# Patient Record
Sex: Female | Born: 1965 | Race: White | Hispanic: No | Marital: Single | Smoking: Former smoker
Health system: Southern US, Community
[De-identification: ages and names within clinical notes are randomized; demographics above are authoritative.]

## PROBLEM LIST (undated history)

## (undated) DIAGNOSIS — J189 Pneumonia, unspecified organism: Secondary | ICD-10-CM

## (undated) DIAGNOSIS — J45909 Unspecified asthma, uncomplicated: Secondary | ICD-10-CM

## (undated) HISTORY — PX: CARDIAC CATHETERIZATION: SHX172

---

## 2009-05-10 ENCOUNTER — Ambulatory Visit: Payer: Self-pay | Admitting: Interventional Radiology

## 2009-05-10 ENCOUNTER — Emergency Department (HOSPITAL_BASED_OUTPATIENT_CLINIC_OR_DEPARTMENT_OTHER): Admission: EM | Admit: 2009-05-10 | Discharge: 2009-05-10 | Payer: Self-pay | Admitting: Emergency Medicine

## 2010-09-10 ENCOUNTER — Emergency Department (HOSPITAL_BASED_OUTPATIENT_CLINIC_OR_DEPARTMENT_OTHER): Admission: EM | Admit: 2010-09-10 | Discharge: 2010-04-24 | Payer: Self-pay | Admitting: Emergency Medicine

## 2010-12-04 HISTORY — PX: OTHER SURGICAL HISTORY: SHX169

## 2011-01-10 LAB — COMPREHENSIVE METABOLIC PANEL
ALT: 8 U/L (ref 0–35)
AST: 17 U/L (ref 0–37)
Albumin: 3.8 g/dL (ref 3.5–5.2)
Alkaline Phosphatase: 62 U/L (ref 39–117)
CO2: 26 mEq/L (ref 19–32)
Chloride: 104 mEq/L (ref 96–112)
GFR calc Af Amer: >60 mL/min (ref >60)
GFR calc non Af Amer: >60 mL/min (ref >60)
Potassium: 3.4 mEq/L — ABNORMAL LOW (ref 3.5–5.1)
Total Bilirubin: 0.2 mg/dL — ABNORMAL LOW (ref 0.3–1.2)

## 2011-01-10 LAB — URINE MICROSCOPIC-ADD ON

## 2011-01-10 LAB — CBC
Platelets: 190 10*3/uL (ref 150–400)
RBC: 3.97 MIL/uL (ref 3.87–5.11)
WBC: 7 10*3/uL (ref 4.0–10.5)

## 2011-01-10 LAB — DIFFERENTIAL
Basophils Absolute: 0 10*3/uL (ref 0.0–0.1)
Basophils Relative: 1 % (ref 0–1)
Eosinophils Absolute: 0.2 10*3/uL (ref 0.0–0.7)
Eosinophils Relative: 2 % (ref 0–5)
Monocytes Absolute: 0.4 10*3/uL (ref 0.1–1.0)

## 2011-01-10 LAB — URINALYSIS, ROUTINE W REFLEX MICROSCOPIC
Bilirubin Urine: NEGATIVE
Glucose, UA: NEGATIVE mg/dL
Ketones, ur: NEGATIVE mg/dL
Protein, ur: NEGATIVE mg/dL

## 2011-09-15 ENCOUNTER — Ambulatory Visit (INDEPENDENT_AMBULATORY_CARE_PROVIDER_SITE_OTHER): Payer: Self-pay | Admitting: Surgery

## 2011-09-23 ENCOUNTER — Encounter (INDEPENDENT_AMBULATORY_CARE_PROVIDER_SITE_OTHER): Payer: Self-pay | Admitting: Surgery

## 2011-09-23 ENCOUNTER — Ambulatory Visit (INDEPENDENT_AMBULATORY_CARE_PROVIDER_SITE_OTHER): Payer: Federal, State, Local not specified - PPO | Admitting: Surgery

## 2011-09-23 DIAGNOSIS — D179 Benign lipomatous neoplasm, unspecified: Secondary | ICD-10-CM

## 2011-09-23 NOTE — Progress Notes (Signed)
Patient ID: Katherine Sims, female   DOB: [birth year], 45 y.o.   MRN: [id number]  Chief Complaint  Patient presents with  . Other    Eval of multiple skin nodules (arms, back, legs)    HPI Katherine Sims is a 45 y.o. female. HPI The patient presents today at the request of Dr. Dierdre Forth due to multiple painful skin nodules. The patient is one overlying her left anterior thigh, one overlying her right triceps muscle, slurred over Hemovac, and 2 others along her right flank. These are been painful. One in her left anterior thigh as been present for about 6 months. He has grown minimally. There is been no redness or drainage from these nodules.  History reviewed. No pertinent past medical history.  Past Surgical History  Procedure Date  . Heart catherterization 12/04/10    Family History  Problem Relation Age of Onset  . Cancer Maternal Aunt     breast    Social History History  Substance Use Topics  . Smoking status: Current Everyday Smoker -- 1.0 packs/day    Types: Cigarettes  . Smokeless tobacco: Never Used  . Alcohol Use: Yes     occasional    No Known Allergies  Current Outpatient Prescriptions  Medication Sig Dispense Refill  . Fluticasone-Salmeterol (ADVAIR DISKUS) 100-50 MCG/DOSE AEPB Inhale 1 puff into the lungs as needed.          Review of Systems Review of Systems  Constitutional: Positive for fatigue. Negative for unexpected weight change.  HENT: Negative.   Eyes: Negative.   Respiratory: Negative.   Cardiovascular: Negative.   Gastrointestinal: Negative.   Genitourinary: Negative.   Musculoskeletal: Positive for myalgias, back pain, joint swelling and arthralgias.  Skin: Negative.   Hematological: Negative.   Psychiatric/Behavioral: The patient is nervous/anxious.     Blood pressure 132/90, pulse 76, temperature 97.8 F (36.6 C), temperature source Temporal, resp. rate 18, height 5\' 4"  (1.626 m), weight 180 lb 12.8 oz (82.01 kg).  Physical  Exam Physical Exam  Constitutional: She is oriented to person, place, and time. She appears well-developed and well-nourished.  HENT:  Head: Normocephalic and atraumatic.  Eyes: EOM are normal. Pupils are equal, round, and reactive to light.  Neck: Normal range of motion. Neck supple.  Musculoskeletal: She exhibits tenderness.  Neurological: She is alert and oriented to person, place, and time. A cranial nerve deficit is present.  Skin:     Psychiatric: She has a normal mood and affect. Her behavior is normal. Judgment and thought content normal.    Data Reviewed   Assessment    Lipoma left thigh 1 cm Lipoma right arm 1 cm Fibromyalgia?    Plan    The patient is too small subcutaneous nodules. These feel to be small lipoma. The areas I checked which revealed her lower back and right flank did not have any appreciable nodule. She has significant musculoskeletal pain. She may have fibromyalgia. These nodules do not require surgical attention since this will not relieve her multiple complaints. They are subcutaneous and are not impinging on any major neural vascular structures. I recommended that she speak with her primary care doctor for further workup.       Katherine Sims A. 09/23/2011, 4:36 PM

## 2011-09-23 NOTE — Patient Instructions (Signed)
Lipoma A lipoma is a noncancerous (benign) tumor composed of fat cells. They are usually found under the skin (subcutaneous). A lipoma may occur in any tissue of the body that contains fat. Common areas for lipomas to appear include the back, shoulders, buttocks, and thighs. Lipomas are a very common soft tissue growth. They are soft and grow slowly. Most problems caused by a lipoma depend on where it is growing. DIAGNOSIS  A lipoma can be diagnosed with a physical exam. These tumors rarely become cancerous, but radiographic studies can help determine this for certain. Studies used may include:  Computerized X-ray scans (CT or CAT scan).   Computerized magnetic scans (MRI).  TREATMENT  Small lipomas that are not causing problems may be watched. If a lipoma continues to enlarge or causes problems, removal is often the best treatment. Lipomas can also be removed to improve appearance. Surgery is done to remove the fatty cells and the surrounding capsule. Most often, this is done with medicine that numbs the area (local anesthetic). The removed tissue is examined under a microscope to make sure it is not cancerous. Keep all follow-up appointments with your caregiver. SEEK MEDICAL CARE IF:   The lipoma becomes larger Document Released: 09/10/2002 Document Revised: 06/02/2011 Document Reviewed: 02/20/2010 [facility] Patient Information 2012 [place], [place].

## 2013-10-04 ENCOUNTER — Ambulatory Visit: Payer: Federal, State, Local not specified - PPO

## 2013-10-04 ENCOUNTER — Ambulatory Visit (INDEPENDENT_AMBULATORY_CARE_PROVIDER_SITE_OTHER): Payer: Federal, State, Local not specified - PPO | Admitting: Family Medicine

## 2013-10-04 VITALS — BP 110/80 | HR 59 | Temp 98.2°F | Resp 16 | Ht 64.0 in | Wt 180.0 lb

## 2013-10-04 DIAGNOSIS — J069 Acute upper respiratory infection, unspecified: Secondary | ICD-10-CM

## 2013-10-04 DIAGNOSIS — F17200 Nicotine dependence, unspecified, uncomplicated: Secondary | ICD-10-CM

## 2013-10-04 DIAGNOSIS — J209 Acute bronchitis, unspecified: Secondary | ICD-10-CM

## 2013-10-04 LAB — POCT CBC
GRANULOCYTE PERCENT: 59.5 % (ref 37–80)
HEMATOCRIT: 44.4 % (ref 37.7–47.9)
Hemoglobin: 14.4 g/dL (ref 12.2–16.2)
Lymph, poc: 2.4 (ref 0.6–3.4)
MCH, POC: 31 pg (ref 27–31.2)
MCHC: 32.4 g/dL (ref 31.8–35.4)
MCV: 95.4 fL (ref 80–97)
MID (cbc): 0.4 (ref 0–0.9)
MPV: 8.7 fL (ref 0–99.8)
POC GRANULOCYTE: 4.1 (ref 2–6.9)
POC LYMPH %: 34.4 % (ref 10–50)
POC MID %: 6.1 % (ref 0–12)
Platelet Count, POC: 212 10*3/uL (ref 142–424)
RBC: 4.65 M/uL (ref 4.04–5.48)
RDW, POC: 12.6 %
WBC: 6.9 10*3/uL (ref 4.6–10.2)

## 2013-10-04 MED ORDER — HYDROCODONE-HOMATROPINE 5-1.5 MG/5ML PO SYRP: 5.0000 mL | ORAL_SOLUTION | Freq: Three times a day (TID) | ORAL | Status: DC | PRN

## 2013-10-04 MED ORDER — CETIRIZINE HCL 10 MG PO TABS
10.0000 mg | ORAL_TABLET | Freq: Every day | ORAL | Status: DC
Start: 2013-10-04 — End: 2018-08-21

## 2013-10-04 MED ORDER — METHYLPREDNISOLONE ACETATE 80 MG/ML IJ SUSP
80.0000 mg | Freq: Once | INTRAMUSCULAR | Status: AC
  Administered 2013-10-04: 80 mg via INTRAMUSCULAR

## 2013-10-04 MED ORDER — PSEUDOEPHEDRINE HCL ER 120 MG PO TB12: 120.0000 mg | ORAL_TABLET | Freq: Two times a day (BID) | ORAL | Status: AC

## 2013-10-04 MED ORDER — ALBUTEROL SULFATE HFA 108 (90 BASE) MCG/ACT IN AERS: 2.0000 | INHALATION_SPRAY | RESPIRATORY_TRACT | Status: DC | PRN

## 2013-10-04 MED ORDER — FLUTICASONE-SALMETEROL 100-50 MCG/DOSE IN AEPB
1.0000 | INHALATION_SPRAY | Freq: Two times a day (BID) | RESPIRATORY_TRACT | Status: DC
Start: 2013-10-04 — End: 2018-08-21

## 2013-10-04 MED ORDER — AZITHROMYCIN 250 MG PO TABS: ORAL_TABLET | ORAL | Status: DC

## 2013-10-04 NOTE — Patient Instructions (Signed)
Acute Bronchitis Bronchitis is inflammation of the airways that extend from the windpipe into the lungs (bronchi). The inflammation often causes mucus to develop. This leads to a cough, which is the most common symptom of bronchitis.  In acute bronchitis, the condition usually develops suddenly and goes away over time, usually in a couple weeks. Smoking, allergies, and asthma can make bronchitis worse. Repeated episodes of bronchitis may cause further lung problems.  CAUSES Acute bronchitis is most often caused by the same virus that causes a cold. The virus can spread from person to person (contagious).  SIGNS AND SYMPTOMS   Cough.   Fever.   Coughing up mucus.   Body aches.   Chest congestion.   Chills.   Shortness of breath.   Sore throat.  DIAGNOSIS  Acute bronchitis is usually diagnosed through a physical exam. Tests, such as chest X-rays, are sometimes done to rule out other conditions.  TREATMENT  Acute bronchitis usually goes away in a couple weeks. Often times, no medical treatment is necessary. Medicines are sometimes given for relief of fever or cough. Antibiotics are usually not needed but may be prescribed in certain situations. In some cases, an inhaler may be recommended to help reduce shortness of breath and control the cough. A cool mist vaporizer may also be used to help thin bronchial secretions and make it easier to clear the chest.  HOME CARE INSTRUCTIONS  Get plenty of rest.   Drink enough fluids to keep your urine clear or pale yellow (unless you have a medical condition that requires fluid restriction). Increasing fluids may help thin your secretions and will prevent dehydration.   Only take over-the-counter or prescription medicines as directed by your health care provider.   Avoid smoking and secondhand smoke. Exposure to cigarette smoke or irritating chemicals will make bronchitis worse. If you are a smoker, consider using nicotine gum or skin  patches to help control withdrawal symptoms. Quitting smoking will help your lungs heal faster.   Reduce the chances of another bout of acute bronchitis by washing your hands frequently, avoiding people with cold symptoms, and trying not to touch your hands to your mouth, nose, or eyes.   Follow up with your health care provider as directed.  SEEK MEDICAL CARE IF: Your symptoms do not improve after 1 week of treatment.  SEEK IMMEDIATE MEDICAL CARE IF:  You develop an increased fever or chills.   You have chest pain.   You have severe shortness of breath.  You have bloody sputum.   You develop dehydration.  You develop fainting.  You develop repeated vomiting.  You develop a severe headache. MAKE SURE YOU:   Understand these instructions.  Will watch your condition.  Will get help right away if you are not doing well or get worse. Document Released: 10/28/2004 Document Revised: 05/23/2013 Document Reviewed: 03/13/2013 [employer] Patient Information 2014 [employer].  

## 2013-10-04 NOTE — Progress Notes (Signed)
Subjective:    Patient ID: Katherine Sims, female    DOB: [birth year], 48 y.o.   MRN: [id number] Chief Complaint  Patient presents with  . Cough    X 4 WEEK  . Laryngitis    HPI  No h/o asthma, but is a long-time smoker and has been smoking more lately - not yet up to 1 ppd.  When her sxs began over a month ago and seemed to be mainly seasonal allergy sxs - hay fever type - w/ a lot of clear rhinitis, cough prod of clear sputum, severe HA - better today but feel like cough and congestion is moved down to chest - getting severe chest pain w/ coughing. No fevers but has had chills and sweats. No sinus pressure, no sore throat but severe layrngitis x 4d - lost voice completely for a while.  Has been hearing self wheeze some but not ShoB. Sneezing a lot.  Has tried mucinex in several different forms but sxs cont to wax and wane. Thinks she is getting better for a few d but then cough will just come back worse.  Last night all the post-nasal drip made her nausea, is sleeping well but propped up due to PND.  Has used an inhaler in the past but all are empty now.  History reviewed. No pertinent past medical history. Current Outpatient Prescriptions on File Prior to Visit  Medication Sig Dispense Refill  . Fluticasone-Salmeterol (ADVAIR DISKUS) 100-50 MCG/DOSE AEPB Inhale 1 puff into the lungs as needed.         No current facility-administered medications on file prior to visit.   No Known Allergies  Review of Systems  Constitutional: Positive for chills, diaphoresis, activity change and fatigue. Negative for fever and appetite change.  HENT: Positive for congestion, postnasal drip, rhinorrhea, sneezing and voice change. Negative for dental problem, ear discharge, ear pain, sinus pressure, sore throat, tinnitus and trouble swallowing.   Respiratory: Positive for cough and wheezing. Negative for chest tightness and shortness of breath.   Cardiovascular: Negative for palpitations and leg  swelling.  Gastrointestinal: Positive for nausea. Negative for vomiting and abdominal pain.  Musculoskeletal: Negative for arthralgias and back pain.  Allergic/Immunologic: Positive for environmental allergies. Negative for immunocompromised state.  Neurological: Positive for headaches.  Hematological: Negative for adenopathy.  Psychiatric/Behavioral: Negative for sleep disturbance.      BP 110/80  Pulse 59  Temp(Src) 98.2 F (36.8 C) (Oral)  Resp 16  Ht 5\' 4"  (1.626 m)  Wt 180 lb (81.647 kg)  BMI 30.88 kg/m2  SpO2 98%  LMP 09/13/2013 Objective:   Physical Exam  Constitutional: She is oriented to person, place, and time. She appears well-developed and well-nourished. No distress.  HENT:  Head: Normocephalic and atraumatic.  Right Ear: Tympanic membrane, external ear and ear canal normal.  Left Ear: Tympanic membrane, external ear and ear canal normal.  Nose: Rhinorrhea present. No mucosal edema.  Mouth/Throat: Uvula is midline and mucous membranes are normal. Posterior oropharyngeal erythema present. No oropharyngeal exudate or posterior oropharyngeal edema.  Eyes: Conjunctivae are normal. Right eye exhibits no discharge. Left eye exhibits no discharge. No scleral icterus.  Neck: Normal range of motion. Neck supple.  Cardiovascular: Normal rate, regular rhythm, normal heart sounds and intact distal pulses.   Pulmonary/Chest: Effort normal and breath sounds normal.  Lymphadenopathy:    She has no cervical adenopathy.  Neurological: She is alert and oriented to person, place, and time.  Skin: Skin is warm and  dry. She is not diaphoretic. No erythema.  Psychiatric: She has a normal mood and affect. Her behavior is normal.          Results for orders placed in visit on 10/04/13  POCT CBC      Result Value Range   WBC 6.9  4.6 - 10.2 K/uL   Lymph, poc 2.4  0.6 - 3.4   POC LYMPH PERCENT 34.4  10 - 50 %L   MID (cbc) 0.4  0 - 0.9   POC MID % 6.1  0 - 12 %M   POC Granulocyte  4.1  2 - 6.9   Granulocyte percent 59.5  37 - 80 %G   RBC 4.65  4.04 - 5.48 M/uL   Hemoglobin 14.4  12.2 - 16.2 g/dL   HCT, POC 44.4  37.7 - 47.9 %   MCV 95.4  80 - 97 fL   MCH, POC 31.0  27 - 31.2 pg   MCHC 32.4  31.8 - 35.4 g/dL   RDW, POC 12.6     Platelet Count, POC 212  142 - 424 K/uL   MPV 8.7  0 - 99.8 fL  [facility] reading (PRIMARY) by  Dr. Brigitte Pulse.  No acute abnormality.  EXAM: CHEST 2 VIEW  COMPARISON: None.  FINDINGS: The heart size and mediastinal contours are within normal limits. Both lungs are clear. The visualized skeletal structures are unremarkable.  IMPRESSION: No active cardiopulmonary disease.  Assessment & Plan:  Acute URI - Plan: POCT CBC, DG Chest 2 View  Acute bronchitis - Plan: POCT CBC, DG Chest 2 View, methylPREDNISolone acetate (DEPO-MEDROL) injection 80 mgb - has had cough for >1 mo - on exam pt's lungs actually sound good and clear so will do trial of steroid shot today and restart advair - will have immunosuppression w/ this so cover w/ zpack.  Tobacco use disorder - Plan: methylPREDNISolone acetate (DEPO-MEDROL) injection 80 mg - if continues, may need spirometry/copd testing  Meds ordered this encounter  Medications  . Fluticasone-Salmeterol (ADVAIR DISKUS) 100-50 MCG/DOSE AEPB    Sig: Inhale 1 puff into the lungs 2 (two) times daily.    Dispense:  60 each    Refill:  1  . albuterol (PROVENTIL HFA;VENTOLIN HFA) 108 (90 BASE) MCG/ACT inhaler    Sig: Inhale 2 puffs into the lungs every 4 (four) hours as needed for wheezing or shortness of breath (cough, shortness of breath or wheezing.).    Dispense:  1 Inhaler    Refill:  1  . methylPREDNISolone acetate (DEPO-MEDROL) injection 80 mg    Sig:   . azithromycin (ZITHROMAX) 250 MG tablet    Sig: Take 2 tabs PO x 1 dose, then 1 tab PO QD x 4 days    Dispense:  6 tablet    Refill:  0  . HYDROcodone-homatropine (HYCODAN) 5-1.5 MG/5ML syrup    Sig: Take 5 mLs by mouth every 8 (eight) hours as needed  for cough.    Dispense:  120 mL    Refill:  0  . cetirizine (ZYRTEC) 10 MG tablet    Sig: Take 1 tablet (10 mg total) by mouth daily.    Dispense:  30 tablet    Refill:  1  . pseudoephedrine (SUDAFED 12 HOUR) 120 MG 12 hr tablet    Sig: Take 1 tablet (120 mg total) by mouth 2 (two) times daily.    Dispense:  30 tablet    Refill:  0   Delman Cheadle, MD MPH

## 2014-05-06 DIAGNOSIS — M1712 Unilateral primary osteoarthritis, left knee: Secondary | ICD-10-CM | POA: Insufficient documentation

## 2015-06-28 ENCOUNTER — Emergency Department (HOSPITAL_BASED_OUTPATIENT_CLINIC_OR_DEPARTMENT_OTHER): Payer: Federal, State, Local not specified - PPO

## 2015-06-28 ENCOUNTER — Encounter (HOSPITAL_BASED_OUTPATIENT_CLINIC_OR_DEPARTMENT_OTHER): Payer: Self-pay | Admitting: Emergency Medicine

## 2015-06-28 ENCOUNTER — Emergency Department (HOSPITAL_BASED_OUTPATIENT_CLINIC_OR_DEPARTMENT_OTHER)
Admission: EM | Admit: 2015-06-28 | Discharge: 2015-06-28 | Disposition: A | Discharge status: Home / Self Care | Payer: Federal, State, Local not specified - PPO | Attending: Physician Assistant | Admitting: Physician Assistant

## 2015-06-28 DIAGNOSIS — Z7951 Long term (current) use of inhaled steroids: Secondary | ICD-10-CM | POA: Diagnosis not present

## 2015-06-28 DIAGNOSIS — Z87891 Personal history of nicotine dependence: Secondary | ICD-10-CM | POA: Insufficient documentation

## 2015-06-28 DIAGNOSIS — M25512 Pain in left shoulder: Secondary | ICD-10-CM | POA: Diagnosis present

## 2015-06-28 DIAGNOSIS — R61 Generalized hyperhidrosis: Secondary | ICD-10-CM | POA: Diagnosis not present

## 2015-06-28 DIAGNOSIS — Z79899 Other long term (current) drug therapy: Secondary | ICD-10-CM | POA: Diagnosis not present

## 2015-06-28 DIAGNOSIS — M79602 Pain in left arm: Secondary | ICD-10-CM

## 2015-06-28 DIAGNOSIS — R200 Anesthesia of skin: Secondary | ICD-10-CM | POA: Insufficient documentation

## 2015-06-28 DIAGNOSIS — Z9889 Other specified postprocedural states: Secondary | ICD-10-CM | POA: Insufficient documentation

## 2015-06-28 DIAGNOSIS — M542 Cervicalgia: Secondary | ICD-10-CM | POA: Diagnosis not present

## 2015-06-28 DIAGNOSIS — R079 Chest pain, unspecified: Secondary | ICD-10-CM | POA: Insufficient documentation

## 2015-06-28 LAB — COMPREHENSIVE METABOLIC PANEL
ALBUMIN: 3.7 g/dL (ref 3.5–5.0)
ALT: 14 U/L (ref 14–54)
AST: 17 U/L (ref 15–41)
Alkaline Phosphatase: 57 U/L (ref 38–126)
Anion gap: 8 (ref 5–15)
BUN: 17 mg/dL (ref 6–20)
CHLORIDE: 104 mmol/L (ref 101–111)
CO2: 27 mmol/L (ref 22–32)
Calcium: 8.9 mg/dL (ref 8.9–10.3)
Creatinine, Ser: 0.54 mg/dL (ref 0.44–1.00)
GFR calc Af Amer: >60 mL/min (ref >60)
GLUCOSE: 98 mg/dL (ref 65–99)
POTASSIUM: 3.6 mmol/L (ref 3.5–5.1)
SODIUM: 139 mmol/L (ref 135–145)
Total Bilirubin: 0.4 mg/dL (ref 0.3–1.2)
Total Protein: 7 g/dL (ref 6.5–8.1)

## 2015-06-28 LAB — CBC WITH DIFFERENTIAL/PLATELET
BASOS ABS: 0 10*3/uL (ref 0.0–0.1)
BASOS PCT: 0 %
EOS ABS: 0.2 10*3/uL (ref 0.0–0.7)
EOS PCT: 4 %
HCT: 37.5 % (ref 36.0–46.0)
Hemoglobin: 13 g/dL (ref 12.0–15.0)
Lymphocytes Relative: 44 %
Lymphs Abs: 3 10*3/uL (ref 0.7–4.0)
MCH: 31.4 pg (ref 26.0–34.0)
MCHC: 34.7 g/dL (ref 30.0–36.0)
MCV: 90.6 fL (ref 78.0–100.0)
MONO ABS: 0.5 10*3/uL (ref 0.1–1.0)
Monocytes Relative: 8 %
Neutro Abs: 3.1 10*3/uL (ref 1.7–7.7)
Neutrophils Relative %: 44 %
PLATELETS: 203 10*3/uL (ref 150–400)
RBC: 4.14 MIL/uL (ref 3.87–5.11)
RDW: 11.4 % — AB (ref 11.5–15.5)
WBC: 6.9 10*3/uL (ref 4.0–10.5)

## 2015-06-28 LAB — TROPONIN I

## 2015-06-28 MED ORDER — ASPIRIN EC 325 MG PO TBEC
325.0000 mg | DELAYED_RELEASE_TABLET | Freq: Once | ORAL | Status: AC
  Administered 2015-06-28: 325 mg via ORAL
  Filled 2015-06-28: qty 1

## 2015-06-28 NOTE — ED Notes (Addendum)
To xray, no changes, alert, NAD, calm, interactive, no dyspnea noted. C/o L chest pain and L arm tingling, (denies: sob, nvd, cough, congestion, cold sx, recent illness, dizziness, fever or other sx).

## 2015-06-28 NOTE — ED Notes (Signed)
Patient reports that she started to have pain to her left chest yesterday. The patient reports that she was going to bed, the patient reports that she was have a "burning pain into her left arm and hand, and in her chest"

## 2015-06-28 NOTE — ED Notes (Signed)
Dr. Thomasene Lot in to see pt.

## 2015-06-28 NOTE — Discharge Instructions (Signed)
We are unsure what caused your arm pain and left neck pain. We are glad to report that your EKG chest x-ray and initial labs are negative. Please return with any increas in pain. Please follow up with your regular doctor on Monday.  Chest Pain (Nonspecific) It is often hard to give a specific diagnosis for the cause of chest pain. There is always a chance that your pain could be related to something serious, such as a heart attack or a blood clot in the lungs. You need to follow up with your health care provider for further evaluation. CAUSES   Heartburn.  Pneumonia or bronchitis.  Anxiety or stress.  Inflammation around your heart (pericarditis) or lung (pleuritis or pleurisy).  A blood clot in the lung.  A collapsed lung (pneumothorax). It can develop suddenly on its own (spontaneous pneumothorax) or from trauma to the chest.  Shingles infection (herpes zoster virus). The chest wall is composed of bones, muscles, and cartilage. Any of these can be the source of the pain.  The bones can be bruised by injury.  The muscles or cartilage can be strained by coughing or overwork.  The cartilage can be affected by inflammation and become sore (costochondritis). DIAGNOSIS  Lab tests or other studies may be needed to find the cause of your pain. Your health care provider may have you take a test called an ambulatory electrocardiogram (ECG). An ECG records your heartbeat patterns over a 24-hour period. You may also have other tests, such as:  Transthoracic echocardiogram (TTE). During echocardiography, sound waves are used to evaluate how blood flows through your heart.  Transesophageal echocardiogram (TEE).  Cardiac monitoring. This allows your health care provider to monitor your heart rate and rhythm in real time.  Holter monitor. This is a portable device that records your heartbeat and can help diagnose heart arrhythmias. It allows your health care provider to track your heart activity  for several days, if needed.  Stress tests by exercise or by giving medicine that makes the heart beat faster. TREATMENT   Treatment depends on what may be causing your chest pain. Treatment may include:  Acid blockers for heartburn.  Anti-inflammatory medicine.  Pain medicine for inflammatory conditions.  Antibiotics if an infection is present.  You may be advised to change lifestyle habits. This includes stopping smoking and avoiding alcohol, caffeine, and chocolate.  You may be advised to keep your head raised (elevated) when sleeping. This reduces the chance of acid going backward from your stomach into your esophagus. Most of the time, nonspecific chest pain will improve within 2-3 days with rest and mild pain medicine.  HOME CARE INSTRUCTIONS   If antibiotics were prescribed, take them as directed. Finish them even if you start to feel better.  For the next few days, avoid physical activities that bring on chest pain. Continue physical activities as directed.  Do not use any tobacco products, including cigarettes, chewing tobacco, or electronic cigarettes.  Avoid drinking alcohol.  Only take medicine as directed by your health care provider.  Follow your health care provider's suggestions for further testing if your chest pain does not go away.  Keep any follow-up appointments you made. If you do not go to an appointment, you could develop lasting (chronic) problems with pain. If there is any problem keeping an appointment, call to reschedule. SEEK MEDICAL CARE IF:   Your chest pain does not go away, even after treatment.  You have a rash with blisters on your chest.  You have a fever. SEEK IMMEDIATE MEDICAL CARE IF:   You have increased chest pain or pain that spreads to your arm, neck, jaw, back, or abdomen.  You have shortness of breath.  You have an increasing cough, or you cough up blood.  You have severe back or abdominal pain.  You feel nauseous or  vomit.  You have severe weakness.  You faint.  You have chills. This is an emergency. Do not wait to see if the pain will go away. Get medical help at once. Call your local emergency services (911 in [place] Do not drive yourself to the hospital. MAKE SURE YOU:   Understand these instructions.  Will watch your condition.  Will get help right away if you are not doing well or get worse. Document Released: 06/30/2005 Document Revised: 09/25/2013 Document Reviewed: 04/25/2008 [facility] Patient Information 2015 [place], [place]. This information is not intended to replace advice given to you by your health care provider. Make sure you discuss any questions you have with your health care provider.
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
## 2016-03-30 ENCOUNTER — Emergency Department (HOSPITAL_BASED_OUTPATIENT_CLINIC_OR_DEPARTMENT_OTHER)
Admission: EM | Admit: 2016-03-30 | Discharge: 2016-03-30 | Disposition: A | Discharge status: Home / Self Care | Payer: Federal, State, Local not specified - PPO | Attending: Emergency Medicine | Admitting: Emergency Medicine

## 2016-03-30 ENCOUNTER — Encounter (HOSPITAL_BASED_OUTPATIENT_CLINIC_OR_DEPARTMENT_OTHER): Payer: Self-pay | Admitting: Emergency Medicine

## 2016-03-30 ENCOUNTER — Emergency Department (HOSPITAL_BASED_OUTPATIENT_CLINIC_OR_DEPARTMENT_OTHER): Payer: Federal, State, Local not specified - PPO

## 2016-03-30 DIAGNOSIS — S060X0A Concussion without loss of consciousness, initial encounter: Secondary | ICD-10-CM | POA: Diagnosis not present

## 2016-03-30 DIAGNOSIS — Y929 Unspecified place or not applicable: Secondary | ICD-10-CM | POA: Diagnosis not present

## 2016-03-30 DIAGNOSIS — Y31XXXA Falling, lying or running before or into moving object, undetermined intent, initial encounter: Secondary | ICD-10-CM | POA: Diagnosis not present

## 2016-03-30 DIAGNOSIS — S0990XA Unspecified injury of head, initial encounter: Secondary | ICD-10-CM | POA: Diagnosis present

## 2016-03-30 DIAGNOSIS — Y939 Activity, unspecified: Secondary | ICD-10-CM | POA: Insufficient documentation

## 2016-03-30 DIAGNOSIS — Y999 Unspecified external cause status: Secondary | ICD-10-CM | POA: Diagnosis not present

## 2016-03-30 DIAGNOSIS — Z87891 Personal history of nicotine dependence: Secondary | ICD-10-CM | POA: Insufficient documentation

## 2016-03-30 DIAGNOSIS — J45909 Unspecified asthma, uncomplicated: Secondary | ICD-10-CM | POA: Diagnosis not present

## 2016-03-30 HISTORY — DX: Unspecified asthma, uncomplicated: J45.909

## 2016-03-30 HISTORY — DX: Pneumonia, unspecified organism: J18.9

## 2016-03-30 MED ORDER — DIPHENHYDRAMINE HCL 50 MG/ML IJ SOLN
25.0000 mg | Freq: Once | INTRAMUSCULAR | Status: AC
  Administered 2016-03-30: 25 mg via INTRAVENOUS
  Filled 2016-03-30: qty 1

## 2016-03-30 MED ORDER — PROCHLORPERAZINE EDISYLATE 5 MG/ML IJ SOLN
10.0000 mg | Freq: Once | INTRAMUSCULAR | Status: AC
  Administered 2016-03-30: 10 mg via INTRAVENOUS
  Filled 2016-03-30: qty 2

## 2016-03-30 NOTE — ED Notes (Signed)
Piece of aluminum pipe fell onto top of pts head this evening.  No LOC. Nausea but no vomiting. HA.
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
## 2016-03-30 NOTE — Discharge Instructions (Signed)
Concussion, Adult  A concussion, or closed-head injury, is a brain injury caused by a direct blow to the head or by a quick and sudden movement (jolt) of the head or neck. Concussions are usually not life-threatening. Even so, the effects of a concussion can be serious. If you have had a concussion before, you are more likely to experience concussion-like symptoms after a direct blow to the head.   CAUSES  · Direct blow to the head, such as from running into another player during a soccer game, being hit in a fight, or hitting your head on a hard surface.  · A jolt of the head or neck that causes the brain to move back and forth inside the skull, such as in a car crash.  SIGNS AND SYMPTOMS  The signs of a concussion can be hard to notice. Early on, they may be missed by you, family members, and health care providers. You may look fine but act or feel differently.  Symptoms are usually temporary, but they may last for days, weeks, or even longer. Some symptoms may appear right away while others may not show up for hours or days. Every head injury is different. Symptoms include:  · Mild to moderate headaches that will not go away.  · A feeling of pressure inside your head.  · Having more trouble than usual:    Learning or remembering things you have heard.    Answering questions.    Paying attention or concentrating.    Organizing daily tasks.    Making decisions and solving problems.  · Slowness in thinking, acting or reacting, speaking, or reading.  · Getting lost or being easily confused.  · Feeling tired all the time or lacking energy (fatigued).  · Feeling drowsy.  · Sleep disturbances.    Sleeping more than usual.    Sleeping less than usual.    Trouble falling asleep.    Trouble sleeping (insomnia).  · Loss of balance or feeling lightheaded or dizzy.  · Nausea or vomiting.  · Numbness or tingling.  · Increased sensitivity to:    Sounds.    Lights.    Distractions.  · Vision problems or eyes that tire  easily.  · Diminished sense of taste or smell.  · Ringing in the ears.  · Mood changes such as feeling sad or anxious.  · Becoming easily irritated or angry for little or no reason.  · Lack of motivation.  · Seeing or hearing things other people do not see or hear (hallucinations).  DIAGNOSIS  Your health care provider can usually diagnose a concussion based on a description of your injury and symptoms. He or she will ask whether you passed out (lost consciousness) and whether you are having trouble remembering events that happened right before and during your injury.  Your evaluation might include:  · A brain scan to look for signs of injury to the brain. Even if the test shows no injury, you may still have a concussion.  · Blood tests to be sure other problems are not present.  TREATMENT  · Concussions are usually treated in an emergency department, in urgent care, or at a clinic. You may need to stay in the hospital overnight for further treatment.  · Tell your health care provider if you are taking any medicines, including prescription medicines, over-the-counter medicines, and natural remedies. Some medicines, such as blood thinners (anticoagulants) and aspirin, may increase the chance of complications. Also tell your health care   provider whether you have had alcohol or are taking illegal drugs. This information may affect treatment.  · Your health care provider will send you home with important instructions to follow.  · How fast you will recover from a concussion depends on many factors. These factors include how severe your concussion is, what part of your brain was injured, your age, and how healthy you were before the concussion.  · Most people with mild injuries recover fully. Recovery can take time. In general, recovery is slower in older persons. Also, persons who have had a concussion in the past or have other medical problems may find that it takes longer to recover from their current injury.  HOME  CARE INSTRUCTIONS  General Instructions  · Carefully follow the directions your health care provider gave you.  · Only take over-the-counter or prescription medicines for pain, discomfort, or fever as directed by your health care provider.  · Take only those medicines that your health care provider has approved.  · Do not drink alcohol until your health care provider says you are well enough to do so. Alcohol and certain other drugs may slow your recovery and can put you at risk of further injury.  · If it is harder than usual to remember things, write them down.  · If you are easily distracted, try to do one thing at a time. For example, do not try to watch TV while fixing dinner.  · Talk with family members or close friends when making important decisions.  · Keep all follow-up appointments. Repeated evaluation of your symptoms is recommended for your recovery.  · Watch your symptoms and tell others to do the same. Complications sometimes occur after a concussion. Older adults with a brain injury may have a higher risk of serious complications, such as a blood clot on the brain.  · Tell your teachers, school nurse, school counselor, coach, athletic trainer, or work manager about your injury, symptoms, and restrictions. Tell them about what you can or cannot do. They should watch for:    Increased problems with attention or concentration.    Increased difficulty remembering or learning new information.    Increased time needed to complete tasks or assignments.    Increased irritability or decreased ability to cope with stress.    Increased symptoms.  · Rest. Rest helps the brain to heal. Make sure you:    Get plenty of sleep at night. Avoid staying up late at night.    Keep the same bedtime hours on weekends and weekdays.    Rest during the day. Take daytime naps or rest breaks when you feel tired.  · Limit activities that require a lot of thought or concentration. These include:    Doing homework or job-related  work.    Watching TV.    Working on the computer.  · Avoid any situation where there is potential for another head injury (football, hockey, soccer, basketball, martial arts, downhill snow sports and horseback riding). Your condition will get worse every time you experience a concussion. You should avoid these activities until you are evaluated by the appropriate follow-up health care providers.  Returning To Your Regular Activities  You will need to return to your normal activities slowly, not all at once. You must give your body and brain enough time for recovery.  · Do not return to sports or other athletic activities until your health care provider tells you it is safe to do so.  · Ask   your health care provider when you can drive, ride a bicycle, or operate heavy machinery. Your ability to react may be slower after a brain injury. Never do these activities if you are dizzy.  · Ask your health care provider about when you can return to work or school.  Preventing Another Concussion  It is very important to avoid another brain injury, especially before you have recovered. In rare cases, another injury can lead to permanent brain damage, brain swelling, or death. The risk of this is greatest during the first 7-10 days after a head injury. Avoid injuries by:  · Wearing a seat belt when riding in a car.  · Drinking alcohol only in moderation.  · Wearing a helmet when biking, skiing, skateboarding, skating, or doing similar activities.  · Avoiding activities that could lead to a second concussion, such as contact or recreational sports, until your health care provider says it is okay.  · Taking safety measures in your home.    Remove clutter and tripping hazards from floors and stairways.    Use grab bars in bathrooms and handrails by stairs.    Place non-slip mats on floors and in bathtubs.    Improve lighting in dim areas.  SEEK MEDICAL CARE IF:  · You have increased problems paying attention or  concentrating.  · You have increased difficulty remembering or learning new information.  · You need more time to complete tasks or assignments than before.  · You have increased irritability or decreased ability to cope with stress.  · You have more symptoms than before.  Seek medical care if you have any of the following symptoms for more than 2 weeks after your injury:  · Lasting (chronic) headaches.  · Dizziness or balance problems.  · Nausea.  · Vision problems.  · Increased sensitivity to noise or light.  · Depression or mood swings.  · Anxiety or irritability.  · Memory problems.  · Difficulty concentrating or paying attention.  · Sleep problems.  · Feeling tired all the time.  SEEK IMMEDIATE MEDICAL CARE IF:  · You have severe or worsening headaches. These may be a sign of a blood clot in the brain.  · You have weakness (even if only in one hand, leg, or part of the face).  · You have numbness.  · You have decreased coordination.  · You vomit repeatedly.  · You have increased sleepiness.  · One pupil is larger than the other.  · You have convulsions.  · You have slurred speech.  · You have increased confusion. This may be a sign of a blood clot in the brain.  · You have increased restlessness, agitation, or irritability.  · You are unable to recognize people or places.  · You have neck pain.  · It is difficult to wake you up.  · You have unusual behavior changes.  · You lose consciousness.  MAKE SURE YOU:  · Understand these instructions.  · Will watch your condition.  · Will get help right away if you are not doing well or get worse.     This information is not intended to replace advice given to you by your health care provider. Make sure you discuss any questions you have with your health care provider.     Document Released: 12/11/2003 Document Revised: 10/11/2014 Document Reviewed: 04/12/2013  [employer]vier Interactive Patient Education ©2016 [employer]
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
## 2016-06-19 ENCOUNTER — Emergency Department (HOSPITAL_COMMUNITY)
Admission: EM | Admit: 2016-06-19 | Discharge: 2016-06-19 | Disposition: A | Discharge status: Home / Self Care | Payer: Federal, State, Local not specified - PPO | Attending: Emergency Medicine | Admitting: Emergency Medicine

## 2016-06-19 ENCOUNTER — Encounter (HOSPITAL_COMMUNITY): Payer: Self-pay | Admitting: *Deleted

## 2016-06-19 DIAGNOSIS — Z87891 Personal history of nicotine dependence: Secondary | ICD-10-CM | POA: Insufficient documentation

## 2016-06-19 DIAGNOSIS — Y999 Unspecified external cause status: Secondary | ICD-10-CM | POA: Diagnosis not present

## 2016-06-19 DIAGNOSIS — J45909 Unspecified asthma, uncomplicated: Secondary | ICD-10-CM | POA: Insufficient documentation

## 2016-06-19 DIAGNOSIS — Y939 Activity, unspecified: Secondary | ICD-10-CM | POA: Insufficient documentation

## 2016-06-19 DIAGNOSIS — R51 Headache: Secondary | ICD-10-CM | POA: Diagnosis not present

## 2016-06-19 DIAGNOSIS — Y92410 Unspecified street and highway as the place of occurrence of the external cause: Secondary | ICD-10-CM | POA: Diagnosis not present

## 2016-06-19 DIAGNOSIS — R519 Headache, unspecified: Secondary | ICD-10-CM

## 2016-06-19 MED ORDER — ACETAMINOPHEN 500 MG PO TABS: 1000.0000 mg | ORAL_TABLET | Freq: Once | ORAL | Status: DC

## 2016-06-19 NOTE — ED Notes (Signed)
Pt is in stable condition upon d/c and ambulates from ED. 
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
## 2017-03-11 DIAGNOSIS — M1710 Unilateral primary osteoarthritis, unspecified knee: Secondary | ICD-10-CM | POA: Diagnosis not present

## 2017-04-25 DIAGNOSIS — R1110 Vomiting, unspecified: Secondary | ICD-10-CM | POA: Diagnosis not present

## 2017-04-25 DIAGNOSIS — R51 Headache: Secondary | ICD-10-CM | POA: Diagnosis not present

## 2017-04-25 DIAGNOSIS — R42 Dizziness and giddiness: Secondary | ICD-10-CM | POA: Diagnosis not present

## 2017-04-25 DIAGNOSIS — M797 Fibromyalgia: Secondary | ICD-10-CM | POA: Diagnosis not present

## 2017-04-25 DIAGNOSIS — R110 Nausea: Secondary | ICD-10-CM | POA: Diagnosis not present

## 2017-04-27 DIAGNOSIS — H8309 Labyrinthitis, unspecified ear: Secondary | ICD-10-CM | POA: Diagnosis not present

## 2017-08-10 ENCOUNTER — Encounter (HOSPITAL_BASED_OUTPATIENT_CLINIC_OR_DEPARTMENT_OTHER): Payer: Self-pay

## 2017-08-10 ENCOUNTER — Emergency Department (HOSPITAL_BASED_OUTPATIENT_CLINIC_OR_DEPARTMENT_OTHER)
Admission: EM | Admit: 2017-08-10 | Discharge: 2017-08-10 | Disposition: A | Discharge status: Home / Self Care | Payer: Federal, State, Local not specified - PPO | Attending: Emergency Medicine | Admitting: Emergency Medicine

## 2017-08-10 ENCOUNTER — Other Ambulatory Visit: Payer: Self-pay

## 2017-08-10 DIAGNOSIS — Z87891 Personal history of nicotine dependence: Secondary | ICD-10-CM | POA: Diagnosis not present

## 2017-08-10 DIAGNOSIS — Z79899 Other long term (current) drug therapy: Secondary | ICD-10-CM | POA: Diagnosis not present

## 2017-08-10 DIAGNOSIS — J45909 Unspecified asthma, uncomplicated: Secondary | ICD-10-CM | POA: Diagnosis not present

## 2017-08-10 DIAGNOSIS — H5789 Other specified disorders of eye and adnexa: Secondary | ICD-10-CM | POA: Diagnosis not present

## 2017-08-10 MED ORDER — TETRACAINE HCL 0.5 % OP SOLN
2.0000 [drp] | Freq: Once | OPHTHALMIC | Status: AC
  Administered 2017-08-10: 2 [drp] via OPHTHALMIC
  Filled 2017-08-10: qty 4

## 2017-08-10 MED ORDER — ERYTHROMYCIN 5 MG/GM OP OINT: TOPICAL_OINTMENT | OPHTHALMIC | 0 refills | Status: DC

## 2017-08-10 MED ORDER — FLUORESCEIN SODIUM 0.6 MG OP STRP
ORAL_STRIP | OPHTHALMIC | Status: AC
  Filled 2017-08-10: qty 1

## 2017-08-10 MED ORDER — FLUORESCEIN SODIUM 1 MG OP STRP
1.0000 | ORAL_STRIP | Freq: Once | OPHTHALMIC | Status: AC
  Administered 2017-08-10: 1 via OPHTHALMIC

## 2017-08-10 NOTE — ED Notes (Signed)
Pt reports flushing her eyes with water prior to arrival. Pt reports some relief.

## 2017-08-10 NOTE — ED Provider Notes (Signed)
[facility] EMERGENCY DEPARTMENT Provider Note   CSN: [id number] Arrival date & time: 08/10/17  2012     History   Chief Complaint Chief Complaint  Patient presents with  . Eye Problem    HPI Katherine Sims is a 51 y.o. female.  51 yo F with a chief complaint of left eye pain.  The patient was pouring liquid plumber into the toilet when something splashed up into her eye.  She flushed it out but still had some continued pain.  Denies any change of her vision.  Denies contact lens use.  Denies discharge.   The history is provided by the patient.  Eye Problem   This is a new problem. The current episode started 3 to 5 hours ago. The problem occurs constantly. The problem has not changed since onset.There is a problem in the left eye. The injury mechanism was a chemical exposure. The pain is at a severity of 4/10. The pain is moderate. There is no history of trauma to the eye. There is no known exposure to pink eye. She does not wear contacts. Pertinent negatives include no blurred vision, no decreased vision, no discharge, no foreign body sensation, no eye redness, no nausea and no vomiting. She has tried water for the symptoms. The treatment provided mild relief.    Past Medical History:  Diagnosis Date  . Asthma   . Pneumonia     There are no active problems to display for this patient.   Past Surgical History:  Procedure Laterality Date  . CARDIAC CATHETERIZATION    . heart catherterization  12/04/10    OB History    No data available       Home Medications    Prior to Admission medications   Medication Sig Start Date End Date Taking? Authorizing Provider  albuterol (PROVENTIL HFA;VENTOLIN HFA) 108 (90 BASE) MCG/ACT inhaler Inhale 2 puffs into the lungs every 4 (four) hours as needed for wheezing or shortness of breath (cough, shortness of breath or wheezing.). 10/04/13   Shawnee Knapp, MD  azithromycin (ZITHROMAX) 250 MG tablet Take 2 tabs PO x 1 dose,  then 1 tab PO QD x 4 days 10/04/13   Shawnee Knapp, MD  cetirizine (ZYRTEC) 10 MG tablet Take 1 tablet (10 mg total) by mouth daily. 10/04/13   Shawnee Knapp, MD  erythromycin ophthalmic ointment Place a 1/2 inch ribbon of ointment into the lower eyelid four times a day. 08/10/17   Deno Etienne, DO  Fluticasone-Salmeterol (ADVAIR DISKUS) 100-50 MCG/DOSE AEPB Inhale 1 puff into the lungs 2 (two) times daily. 10/04/13   Shawnee Knapp, MD  HYDROcodone-homatropine [facility]) 5-1.5 MG/5ML syrup Take 5 mLs by mouth every 8 (eight) hours as needed for cough. 10/04/13   Shawnee Knapp, MD  pseudoephedrine (SUDAFED 12 HOUR) 120 MG 12 hr tablet Take 1 tablet (120 mg total) by mouth 2 (two) times daily. 10/04/13   Shawnee Knapp, MD    Family History Family History  Problem Relation Age of Onset  . Cancer Maternal Aunt        breast    Social History Social History   Tobacco Use  . Smoking status: Former Smoker    Packs/day: 0.00  . Smokeless tobacco: Never Used  Substance Use Topics  . Alcohol use: No    Frequency: Never  . Drug use: No     Allergies   Patient has no known allergies.   Review of Systems  Review of Systems  Constitutional: Negative for chills and fever.  HENT: Negative for congestion and rhinorrhea.   Eyes: Positive for pain. Negative for blurred vision, discharge, redness and visual disturbance.  Respiratory: Negative for shortness of breath and wheezing.   Cardiovascular: Negative for chest pain and palpitations.  Gastrointestinal: Negative for nausea and vomiting.  Genitourinary: Negative for dysuria and urgency.  Musculoskeletal: Negative for arthralgias and myalgias.  Skin: Negative for pallor and wound.  Neurological: Negative for dizziness and headaches.     Physical Exam Updated Vital Signs BP 127/70 (BP Location: Left Arm)   Pulse 78   Temp 98.5 F (36.9 C) (Oral)   Resp 18   Ht 5\' 4"  (1.626 m)   Wt 88.5 kg (195 lb)   LMP 06/14/2015   SpO2 99%   BMI 33.47 kg/m    Physical Exam  Constitutional: She is oriented to person, place, and time. She appears well-developed and well-nourished. No distress.  HENT:  Head: Normocephalic and atraumatic.  Eyes: Conjunctivae and EOM are normal. Pupils are equal, round, and reactive to light. Right eye exhibits no chemosis, no discharge, no exudate and no hordeolum. No foreign body present in the right eye. Left eye exhibits no chemosis, no discharge, no exudate and no hordeolum. No foreign body present in the left eye. Right conjunctiva is not injected. Right conjunctiva has no hemorrhage. Left conjunctiva is not injected. Left conjunctiva has no hemorrhage.  Slit lamp exam:      The left eye shows no corneal abrasion.  Her pH is normal to the left eye.  No corneal abrasion.  Neck: Normal range of motion. Neck supple.  Cardiovascular: Normal rate and regular rhythm. Exam reveals no gallop and no friction rub.  No murmur heard. Pulmonary/Chest: Effort normal. She has no wheezes. She has no rales.  Abdominal: Soft. She exhibits no distension. There is no tenderness.  Musculoskeletal: She exhibits no edema or tenderness.  Neurological: She is alert and oriented to person, place, and time.  Skin: Skin is warm and dry. She is not diaphoretic.  Psychiatric: She has a normal mood and affect. Her behavior is normal.  Nursing note and vitals reviewed.    ED Treatments / Results  Labs (all labs ordered are listed, but only abnormal results are displayed) Labs Reviewed - No data to display  EKG  EKG Interpretation None       Radiology No results found.  Procedures Procedures (including critical care time)  Medications Ordered in ED Medications  tetracaine (PONTOCAINE) 0.5 % ophthalmic solution 2 drop (2 drops Left Eye Given 08/10/17 2040)  fluorescein ophthalmic strip 1 strip (1 strip Left Eye Given 08/10/17 2040)     Initial Impression / Assessment and Plan / ED Course  I have reviewed the triage vital  signs and the nursing notes.  Pertinent labs & imaging results that were available during my care of the patient were reviewed by me and considered in my medical decision making (see chart for details).     51 yo F with a chief complaint of left eye pain.  Was likely hit by toilet water based on the mechanism.  The pH is normal.  No corneal abrasion.  Discharge home.  10:46 PM:  I have discussed the diagnosis/risks/treatment options with the patient and family and believe the pt to be eligible for discharge home to follow-up with PCP. We also discussed returning to the ED immediately if new or worsening sx occur. We discussed the  sx which are most concerning (e.g., sudden worsening pain, fever, inability to tolerate by mouth) that necessitate immediate return. Medications administered to the patient during their visit and any new prescriptions provided to the patient are listed below.  Medications given during this visit Medications  tetracaine (PONTOCAINE) 0.5 % ophthalmic solution 2 drop (2 drops Left Eye Given 08/10/17 2040)  fluorescein ophthalmic strip 1 strip (1 strip Left Eye Given 08/10/17 2040)     The patient appears reasonably screen and/or stabilized for discharge and I doubt any other medical condition or other [facility] requiring further screening, evaluation, or treatment in the ED at this time prior to discharge.    Final Clinical Impressions(s) / ED Diagnoses   Final diagnoses:  Eye irritation    ED Discharge Orders        Ordered    erythromycin ophthalmic ointment     08/10/17 2130       Deno Etienne, DO 08/10/17 2246

## 2017-08-10 NOTE — ED Notes (Signed)
ED Provider at bedside. 

## 2017-08-10 NOTE — ED Triage Notes (Signed)
Pt states she got liquid plummer in left eye approx 730pm-NAD-steady gait

## 2018-02-17 DIAGNOSIS — L509 Urticaria, unspecified: Secondary | ICD-10-CM | POA: Diagnosis not present

## 2018-02-17 DIAGNOSIS — R51 Headache: Secondary | ICD-10-CM | POA: Diagnosis not present

## 2018-02-17 DIAGNOSIS — T782XXA Anaphylactic shock, unspecified, initial encounter: Secondary | ICD-10-CM | POA: Diagnosis not present

## 2018-02-17 DIAGNOSIS — X58XXXA Exposure to other specified factors, initial encounter: Secondary | ICD-10-CM | POA: Diagnosis not present

## 2018-02-17 DIAGNOSIS — M79641 Pain in right hand: Secondary | ICD-10-CM | POA: Diagnosis not present

## 2018-02-17 DIAGNOSIS — W57XXXA Bitten or stung by nonvenomous insect and other nonvenomous arthropods, initial encounter: Secondary | ICD-10-CM | POA: Diagnosis not present

## 2018-02-17 DIAGNOSIS — T63441A Toxic effect of venom of bees, accidental (unintentional), initial encounter: Secondary | ICD-10-CM | POA: Diagnosis not present

## 2018-02-17 DIAGNOSIS — T63484A Toxic effect of venom of other arthropod, undetermined, initial encounter: Secondary | ICD-10-CM | POA: Diagnosis not present

## 2018-08-15 ENCOUNTER — Emergency Department (HOSPITAL_BASED_OUTPATIENT_CLINIC_OR_DEPARTMENT_OTHER)
Admission: EM | Admit: 2018-08-15 | Discharge: 2018-08-15 | Disposition: A | Discharge status: Home / Self Care | Attending: Emergency Medicine | Admitting: Emergency Medicine

## 2018-08-15 ENCOUNTER — Emergency Department (HOSPITAL_BASED_OUTPATIENT_CLINIC_OR_DEPARTMENT_OTHER)

## 2018-08-15 ENCOUNTER — Other Ambulatory Visit: Payer: Self-pay

## 2018-08-15 ENCOUNTER — Encounter (HOSPITAL_BASED_OUTPATIENT_CLINIC_OR_DEPARTMENT_OTHER): Payer: Self-pay

## 2018-08-15 DIAGNOSIS — J45909 Unspecified asthma, uncomplicated: Secondary | ICD-10-CM | POA: Diagnosis not present

## 2018-08-15 DIAGNOSIS — Z87891 Personal history of nicotine dependence: Secondary | ICD-10-CM | POA: Insufficient documentation

## 2018-08-15 DIAGNOSIS — Z79899 Other long term (current) drug therapy: Secondary | ICD-10-CM | POA: Insufficient documentation

## 2018-08-15 DIAGNOSIS — Y9389 Activity, other specified: Secondary | ICD-10-CM | POA: Insufficient documentation

## 2018-08-15 DIAGNOSIS — S99912A Unspecified injury of left ankle, initial encounter: Secondary | ICD-10-CM | POA: Diagnosis present

## 2018-08-15 DIAGNOSIS — Z955 Presence of coronary angioplasty implant and graft: Secondary | ICD-10-CM | POA: Insufficient documentation

## 2018-08-15 DIAGNOSIS — X501XXA Overexertion from prolonged static or awkward postures, initial encounter: Secondary | ICD-10-CM | POA: Diagnosis not present

## 2018-08-15 DIAGNOSIS — Y990 Civilian activity done for income or pay: Secondary | ICD-10-CM | POA: Insufficient documentation

## 2018-08-15 DIAGNOSIS — Y9289 Other specified places as the place of occurrence of the external cause: Secondary | ICD-10-CM | POA: Diagnosis not present

## 2018-08-15 DIAGNOSIS — S82892A Other fracture of left lower leg, initial encounter for closed fracture: Secondary | ICD-10-CM | POA: Diagnosis not present

## 2018-08-15 DIAGNOSIS — S8262XA Displaced fracture of lateral malleolus of left fibula, initial encounter for closed fracture: Secondary | ICD-10-CM | POA: Diagnosis not present

## 2018-08-15 MED ORDER — OXYCODONE-ACETAMINOPHEN 5-325 MG PO TABS: 1.0000 | ORAL_TABLET | Freq: Four times a day (QID) | ORAL | 0 refills | Status: DC | PRN

## 2018-08-15 NOTE — ED Notes (Signed)
Patient transported to X-ray 

## 2018-08-15 NOTE — ED Notes (Signed)
ED Provider at bedside. 

## 2018-08-15 NOTE — ED Triage Notes (Signed)
Pt rolled left ankle when she was stepping off of a platform at work

## 2018-08-15 NOTE — ED Notes (Signed)
Pt back from XR 

## 2018-08-15 NOTE — ED Provider Notes (Signed)
[place] EMERGENCY DEPARTMENT Provider Note   CSN: [id number] Arrival date & time: 08/15/18  1804     History   Chief Complaint Chief Complaint  Patient presents with  . Ankle Injury    HPI Katherine Sims is a 52 y.o. female.  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 She noticed a auditory "pop" and developed pain in the left ankle.   The history is provided by the patient. No language interpreter was used.  Extremity Pain  This is a new problem. The current episode started 3 to 5 hours ago. The problem has not changed since onset.The symptoms are aggravated by walking and standing.    Past Medical History:  Diagnosis Date  . Asthma   . Pneumonia     There are no active problems to display for this patient.   Past Surgical History:  Procedure Laterality Date  . CARDIAC CATHETERIZATION    . heart catherterization  12/04/10     OB History   None      Home Medications    Prior to Admission medications   Medication Sig Start Date End Date Taking? Authorizing Provider  albuterol (PROVENTIL HFA;VENTOLIN HFA) 108 (90 BASE) MCG/ACT inhaler Inhale 2 puffs into the lungs every 4 (four) hours as needed for wheezing or shortness of breath (cough, shortness of breath or wheezing.). 10/04/13   Shawnee Knapp, MD  azithromycin (ZITHROMAX) 250 MG tablet Take 2 tabs PO x 1 dose, then 1 tab PO QD x 4 days 10/04/13   Shawnee Knapp, MD  cetirizine (ZYRTEC) 10 MG tablet Take 1 tablet (10 mg total) by mouth daily. 10/04/13   Shawnee Knapp, MD  erythromycin ophthalmic ointment Place a 1/2 inch ribbon of ointment into the lower eyelid four times a day. 08/10/17   Deno Etienne, DO  Fluticasone-Salmeterol (ADVAIR DISKUS) 100-50 MCG/DOSE AEPB Inhale 1 puff into the lungs 2 (two) times daily. 10/04/13   Shawnee Knapp, MD  HYDROcodone-homatropine [facility]) 5-1.5 MG/5ML syrup Take 5 mLs by mouth every 8 (eight) hours as  needed for cough. 10/04/13   Shawnee Knapp, MD  pseudoephedrine (SUDAFED 12 HOUR) 120 MG 12 hr tablet Take 1 tablet (120 mg total) by mouth 2 (two) times daily. 10/04/13   Shawnee Knapp, MD    Family History Family History  Problem Relation Age of Onset  . Cancer Maternal Aunt        breast    Social History Social History   Tobacco Use  . Smoking status: Former Smoker    Packs/day: 0.00  . Smokeless tobacco: Never Used  Substance Use Topics  . Alcohol use: No    Frequency: Never  . Drug use: No     Allergies   Patient has no known allergies.   Review of Systems Review of Systems  Musculoskeletal: Positive for arthralgias.  All other systems reviewed and are negative.    Physical Exam Updated Vital Signs BP 126/80 (BP Location: Right Arm)   Pulse 94   Temp 98.2 F (36.8 C) (Oral)   Resp 18   Ht 5\' 4"  (1.626 m)   Wt 79.4 kg   LMP 06/14/2015   SpO2 98%   BMI 30.04 kg/m   Physical Exam  Constitutional: She is oriented to person, place, and time. She appears well-developed and well-nourished.  HENT:  Head: Atraumatic.  Eyes: Conjunctivae are normal.  Neck:  Normal range of motion.  Cardiovascular: Normal rate and regular rhythm.  Pulmonary/Chest: Effort normal and breath sounds normal.  Musculoskeletal: She exhibits edema and tenderness.       Left ankle: Tenderness. Lateral malleolus and head of 5th metatarsal tenderness found.  Brisk capillary refill of toes, normal sensation.  Neurological: She is alert and oriented to person, place, and time.  Skin: Skin is warm and dry.  Psychiatric: She has a normal mood and affect.  Nursing note and vitals reviewed.    ED Treatments / Results  Labs (all labs ordered are listed, but only abnormal results are displayed) Labs Reviewed - No data to display  EKG None  Radiology Dg Ankle Complete Left  Result Date: 08/15/2018 CLINICAL DATA:  Fall with lateral malleolus pain. EXAM: LEFT ANKLE COMPLETE - 3+ VIEW  COMPARISON:  None. FINDINGS: There is mild soft tissue swelling at the left lateral malleolus. There is a small lucency at the distal aspect of the fibula with a minimally displaced cortical fragment. The bones are otherwise normal. No dislocation. IMPRESSION: Minimally displaced avulsion fracture of the left lateral malleolus. Electronically Signed   By: Ulyses Jarred M.D.   On: 08/15/2018 19:09   Dg Foot Complete Left  Result Date: 08/15/2018 CLINICAL DATA:  Fall EXAM: LEFT FOOT - COMPLETE 3+ VIEW COMPARISON:  None. FINDINGS: There is no evidence of fracture or dislocation. There is no evidence of arthropathy or other focal bone abnormality. Soft tissues are unremarkable. IMPRESSION: Negative. Electronically Signed   By: Ulyses Jarred M.D.   On: 08/15/2018 19:07    Procedures Procedures (including critical care time)  Medications Ordered in ED Medications - No data to display   Initial Impression / Assessment and Plan / ED Course  I have reviewed the triage vital signs and the nursing notes.  Pertinent labs & imaging results that were available during my care of the patient were reviewed by me and considered in my medical decision making (see chart for details).     Patient with avulsion fracture of left lateral malleolus. Patient placed in cam walker and provided crutches. Rest, ice, elevation and non weight bearing for 3 days. Ibuprofen or tylenol for pain. Prescription for limited amount of percocet provided. Care instructions provided. Return precautions discussed. Follow-up with orthopedics and workman's compensation medical provider.\  Narcotic database consulted. No narcotic prescriptions in last 24 months.  Final Clinical Impressions(s) / ED Diagnoses   Final diagnoses:  Closed fracture of left ankle, initial encounter    ED Discharge Orders         Ordered    oxyCODONE-acetaminophen (PERCOCET) 5-325 MG tablet  Every 6 hours PRN     08/15/18 1932           Etta Quill, NP 08/15/18 2021    Deno Etienne, DO 08/15/18 2302

## 2018-08-17 ENCOUNTER — Encounter: Payer: Self-pay | Admitting: Family Medicine

## 2018-08-17 ENCOUNTER — Ambulatory Visit (INDEPENDENT_AMBULATORY_CARE_PROVIDER_SITE_OTHER): Payer: Federal, State, Local not specified - PPO | Admitting: Family Medicine

## 2018-08-17 VITALS — BP 125/76 | HR 94 | Ht 64.0 in | Wt 170.0 lb

## 2018-08-17 DIAGNOSIS — S99912A Unspecified injury of left ankle, initial encounter: Secondary | ICD-10-CM

## 2018-08-17 MED ORDER — HYDROCODONE-ACETAMINOPHEN 5-325 MG PO TABS: 1.0000 | ORAL_TABLET | Freq: Four times a day (QID) | ORAL | 0 refills | Status: AC | PRN

## 2018-08-17 NOTE — Patient Instructions (Signed)
You have a small avulsion fracture of your distal fibula. Ice the area for 15 minutes at a time, 3-4 times a day Aleve 2 tabs twice a day with food OR ibuprofen 3 tabs three times a day with food for pain and inflammation regularly. Ok to take tylenol with this (500mg  1-2 tabs three times a day). Norco as needed for severe pain - this has tylenol in it so do NOT take it with tylenol. Elevate above the level of your heart when possible Crutches if needed to help with walking Bear weight when tolerated Use boot when up and walking around to help with stability while you recover from this injury. Come out of the boot twice a day to do Up/down and alphabet exercises 2-3 sets of each. Consider physical therapy for strengthening and balance exercises in the future. Expect this to take 6 weeks to heal - you should not need repeat x-rays for this type of fracture. Follow up in 2 weeks.

## 2018-08-21 ENCOUNTER — Encounter: Payer: Self-pay | Admitting: Family Medicine

## 2018-08-21 NOTE — Progress Notes (Signed)
PCP: Egbert Garibaldi, PA-C  Subjective:   HPI: Patient is a 52 y.o. female here for left ankle injury.  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 She stepped back and came off the slab, felt a pop lateral left ankle when stepping down. Immediate pain with swelling lateral ankle with bruising. Pain level currently an 8/10 in cam walker, sharp. Taking percocet as needed. No other skin changes. No numbness.  Past Medical History:  Diagnosis Date  . Asthma   . Pneumonia     Current Outpatient Medications on File Prior to Visit  Medication Sig Dispense Refill  . EPINEPHrine 0.3 mg/0.3 mL IJ SOAJ injection Inject into the muscle.    . pseudoephedrine (SUDAFED 12 HOUR) 120 MG 12 hr tablet Take 1 tablet (120 mg total) by mouth 2 (two) times daily. 30 tablet 0   No current facility-administered medications on file prior to visit.     Past Surgical History:  Procedure Laterality Date  . CARDIAC CATHETERIZATION    . heart catherterization  12/04/10    Allergies  Allergen Reactions  . Bee Venom Anaphylaxis    Social History   Socioeconomic History  . Marital status: Single    Spouse name: Not on file  . Number of children: Not on file  . Years of education: Not on file  . Highest education level: Not on file  Occupational History  . Not on file  Social Needs  . Financial resource strain: Not on file  . Food insecurity:    Worry: Not on file    Inability: Not on file  . Transportation needs:    Medical: Not on file    Non-medical: Not on file  Tobacco Use  . Smoking status: Former Smoker    Packs/day: 0.00  . Smokeless tobacco: Never Used  Substance and Sexual Activity  . Alcohol use: No    Frequency: Never  . Drug use: No  . Sexual activity: Not on file  Lifestyle  . Physical activity:    Days per week: Not on file    Minutes per session: Not on file  . Stress: Not on file  Relationships  . Social  connections:    Talks on phone: Not on file    Gets together: Not on file    Attends religious service: Not on file    Active member of club or organization: Not on file    Attends meetings of clubs or organizations: Not on file    Relationship status: Not on file  . Intimate partner violence:    Fear of current or ex partner: Not on file    Emotionally abused: Not on file    Physically abused: Not on file    Forced sexual activity: Not on file  Other Topics Concern  . Not on file  Social History Narrative  . Not on file    Family History  Problem Relation Age of Onset  . Cancer Maternal Aunt        breast    BP 125/76   Pulse 94   Ht 5\' 4"  (1.626 m)   Wt 170 lb (77.1 kg)   LMP 06/14/2015   BMI 29.18 kg/m   Review of Systems: See HPI above.     Objective:  Physical Exam:  Gen: NAD, comfortable in exam room  Left foot/ankle: No gross deformity, swelling, ecchymoses Mod limitation ROM especially with external rotation.  Able to resist all motions. TTP distal fibula.  No 5th metatarsal, navicular, medial tenderness. Did not test ant drawer and talar tilt with known avulsion fracture.   Negative syndesmotic compression. Thompsons test negative. NV intact distally.  Right foot/ankle: No deformity. FROM with 5/5 strength. No tenderness to palpation. NVI distally.   MSK u/s left ankle:  Avulsion fracture distal fibula visualized.  Peroneal tendons intact.  Assessment & Plan:  1. Left ankle injury - independently reviewed radiographs noting small avulsion fracture below the level of the ankle.  Continue with cam walker.  Icing, aleve or ibuprofen.  Norco as needed for severe pain.  Elevation when possible.  Shown motion exercises to do regularly.  F/u in 2 weeks for reevaluation.  Expect total of 6 weeks to heal.  Light duty.

## 2018-09-04 ENCOUNTER — Ambulatory Visit (INDEPENDENT_AMBULATORY_CARE_PROVIDER_SITE_OTHER): Admitting: Family Medicine

## 2018-09-04 ENCOUNTER — Encounter: Payer: Self-pay | Admitting: Family Medicine

## 2018-09-04 VITALS — BP 129/72 | HR 86 | Ht 64.0 in | Wt 170.0 lb

## 2018-09-04 DIAGNOSIS — S99912D Unspecified injury of left ankle, subsequent encounter: Secondary | ICD-10-CM | POA: Diagnosis not present

## 2018-09-04 NOTE — Addendum Note (Signed)
Addended by: Sherrie George F on: 09/04/2018 01:07 PM   Modules accepted: Orders

## 2018-09-04 NOTE — Progress Notes (Signed)
PCP: Egbert Garibaldi, PA-C  Subjective:   HPI: Patient is a 52 y.o. female here for left ankle injury.  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 She stepped back and came off the slab, felt a pop lateral left ankle when stepping down. Immediate pain with swelling lateral ankle with bruising. Pain level currently an 8/10 in cam walker, sharp. Taking percocet as needed. No other skin changes. No numbness.  12/2: Patient reports her pain is still at a 7/10 level, sharp lateral ankle. Wearing cam walker and using crutches. Associated swelling. Taking ibuprofen during the day with norco at nighttime. No skin changes.  Past Medical History:  Diagnosis Date  . Asthma   . Pneumonia     Current Outpatient Medications on File Prior to Visit  Medication Sig Dispense Refill  . EPINEPHrine 0.3 mg/0.3 mL IJ SOAJ injection Inject into the muscle.    Marland Kitchen HYDROcodone-acetaminophen (NORCO) 5-325 MG tablet Take 1 tablet by mouth every 6 (six) hours as needed for moderate pain. 20 tablet 0  . pseudoephedrine (SUDAFED 12 HOUR) 120 MG 12 hr tablet Take 1 tablet (120 mg total) by mouth 2 (two) times daily. 30 tablet 0   No current facility-administered medications on file prior to visit.     Past Surgical History:  Procedure Laterality Date  . CARDIAC CATHETERIZATION    . heart catherterization  12/04/10    Allergies  Allergen Reactions  . Bee Venom Anaphylaxis    Social History   Socioeconomic History  . Marital status: Single    Spouse name: Not on file  . Number of children: Not on file  . Years of education: Not on file  . Highest education level: Not on file  Occupational History  . Not on file  Social Needs  . Financial resource strain: Not on file  . Food insecurity:    Worry: Not on file    Inability: Not on file  . Transportation needs:    Medical: Not on file    Non-medical: Not on file  Tobacco Use   . Smoking status: Former Smoker    Packs/day: 0.00  . Smokeless tobacco: Never Used  Substance and Sexual Activity  . Alcohol use: No    Frequency: Never  . Drug use: No  . Sexual activity: Not on file  Lifestyle  . Physical activity:    Days per week: Not on file    Minutes per session: Not on file  . Stress: Not on file  Relationships  . Social connections:    Talks on phone: Not on file    Gets together: Not on file    Attends religious service: Not on file    Active member of club or organization: Not on file    Attends meetings of clubs or organizations: Not on file    Relationship status: Not on file  . Intimate partner violence:    Fear of current or ex partner: Not on file    Emotionally abused: Not on file    Physically abused: Not on file    Forced sexual activity: Not on file  Other Topics Concern  . Not on file  Social History Narrative  . Not on file    Family History  Problem Relation Age of Onset  . Cancer Maternal Aunt        breast    BP 129/72   Pulse 86  Ht 5\' 4"  (1.626 m)   Wt 170 lb (77.1 kg)   LMP 06/14/2015   BMI 29.18 kg/m   Review of Systems: See HPI above.     Objective:  Physical Exam:  Gen: NAD, comfortable in exam room  Left foot/ankle: Mild swelling laterally.  No other gross deformity, ecchymoses. Mild limitation ROM all directions. TTP tip of distal fibula.  No other tenderness. 1+ ant drawer, negative talar tilt but painful on testing. Negative syndesmotic compression. Thompsons test negative. NV intact distally.  Assessment & Plan:  1. Left ankle injury - small distal fibula avulsion fracture.  Continue with cam walker, crutches - try to wean off the crutches as tolerated.  Start physical therapy in 2 weeks - will place order.  Motion exercises out of boot twice a day.  Ibuprofen with norco as needed.  F/u in 4 weeks.  Continue light duty - work form filled out.

## 2018-09-04 NOTE — Patient Instructions (Signed)
You have a small avulsion fracture of your distal fibula. Ice the area for 15 minutes at a time, 3-4 times a day Aleve 2 tabs twice a day with food OR ibuprofen 3 tabs three times a day with food for pain and inflammation regularly. Ok to take tylenol with this (500mg  1-2 tabs three times a day). Norco as needed for severe pain - this has tylenol in it so do NOT take it with tylenol. Elevate above the level of your heart when possible Crutches if needed to help with walking Bear weight when tolerated Use boot when up and walking around to help with stability while you recover from this injury. Come out of the boot twice a day to do Up/down and alphabet exercises 2-3 sets of each. Start physical therapy in 2 weeks to work on motion, strengthening, balance. Follow up in 4 weeks.

## 2018-10-02 ENCOUNTER — Encounter: Payer: Self-pay | Admitting: Family Medicine

## 2018-10-02 ENCOUNTER — Ambulatory Visit (INDEPENDENT_AMBULATORY_CARE_PROVIDER_SITE_OTHER): Admitting: Family Medicine

## 2018-10-02 VITALS — BP 134/80 | HR 92 | Ht 64.0 in | Wt 170.0 lb

## 2018-10-02 DIAGNOSIS — S99912D Unspecified injury of left ankle, subsequent encounter: Secondary | ICD-10-CM

## 2018-10-02 NOTE — Patient Instructions (Signed)
You have a small avulsion fracture of your distal fibula. Ice the area for 15 minutes at a time, 3-4 times a day Aleve 2 tabs twice a day with food OR ibuprofen 3 tabs three times a day with food for pain and inflammation only if needed. Ok to take tylenol with this (500mg  1-2 tabs three times a day). Elevate above the level of your heart when possible Stop boot but keep it with you the next week in case you need it at times. Start strengthening exercises, balance exercises - 3 sets of 10 once a day We will resubmit the order for physical therapy to workers comp. Follow up in 1 month.

## 2018-10-02 NOTE — Progress Notes (Signed)
PCP: Egbert Garibaldi, PA-C  Subjective:   HPI: Patient is a 52 y.o. female here for left ankle injury.  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 She stepped back and came off the slab, felt a pop lateral left ankle when stepping down. Immediate pain with swelling lateral ankle with bruising. Pain level currently an 8/10 in cam walker, sharp. Taking percocet as needed. No other skin changes. No numbness.  12/2: Patient reports her pain is still at a 7/10 level, sharp lateral ankle. Wearing cam walker and using crutches. Associated swelling. Taking ibuprofen during the day with norco at nighttime. No skin changes.  12/30: Patient reports that she has improved since last visit. Pain is more dull lateral left ankle and currently had a 0 out of 10 level in the boot. She is doing home exercises regularly but has not heard from physical therapy. She has been icing the ankle some.  She had pain medication on Christmas day due to increased pain. She is on light duty at work and she does not walk more than just a little bit around the house out of the boot. No skin changes.  Continues to swell.  Past Medical History:  Diagnosis Date  . Asthma   . Pneumonia     Current Outpatient Medications on File Prior to Visit  Medication Sig Dispense Refill  . EPINEPHrine 0.3 mg/0.3 mL IJ SOAJ injection Inject into the muscle.    Marland Kitchen HYDROcodone-acetaminophen (NORCO) 5-325 MG tablet Take 1 tablet by mouth every 6 (six) hours as needed for moderate pain. 20 tablet 0  . pseudoephedrine (SUDAFED 12 HOUR) 120 MG 12 hr tablet Take 1 tablet (120 mg total) by mouth 2 (two) times daily. 30 tablet 0   No current facility-administered medications on file prior to visit.     Past Surgical History:  Procedure Laterality Date  . CARDIAC CATHETERIZATION    . heart catherterization  12/04/10    Allergies  Allergen Reactions  . Bee Venom  Anaphylaxis    Social History   Socioeconomic History  . Marital status: Single    Spouse name: Not on file  . Number of children: Not on file  . Years of education: Not on file  . Highest education level: Not on file  Occupational History  . Not on file  Social Needs  . Financial resource strain: Not on file  . Food insecurity:    Worry: Not on file    Inability: Not on file  . Transportation needs:    Medical: Not on file    Non-medical: Not on file  Tobacco Use  . Smoking status: Former Smoker    Packs/day: 0.00  . Smokeless tobacco: Never Used  Substance and Sexual Activity  . Alcohol use: No    Frequency: Never  . Drug use: No  . Sexual activity: Not on file  Lifestyle  . Physical activity:    Days per week: Not on file    Minutes per session: Not on file  . Stress: Not on file  Relationships  . Social connections:    Talks on phone: Not on file    Gets together: Not on file    Attends religious service: Not on file    Active member of club or organization: Not on file    Attends meetings of clubs or organizations: Not on file    Relationship status: Not on file  .  Intimate partner violence:    Fear of current or ex partner: Not on file    Emotionally abused: Not on file    Physically abused: Not on file    Forced sexual activity: Not on file  Other Topics Concern  . Not on file  Social History Narrative  . Not on file    Family History  Problem Relation Age of Onset  . Cancer Maternal Aunt        breast    BP 134/80   Pulse 92   Ht 5\' 4"  (1.626 m)   Wt 170 lb (77.1 kg)   LMP 06/14/2015   BMI 29.18 kg/m   Review of Systems: See HPI above.     Objective:  Physical Exam:  Gen: NAD, comfortable in exam room  Left foot/ankle: Mild lateral swelling.  No gross deformity, ecchymoses Mild limitation with 4/5 strength IR and ER.  5/5 plantarflexion and dorsiflexion. TTP mildly tip of distal fibula.  No other tenderness. 1+ ant drawer,  negative talar tilt. Negative syndesmotic compression. Thompsons test negative. NV intact distally.  Assessment & Plan:  1. Left ankle injury -small distal fibula avulsion fracture.  She was advised to stop with the Cam walker but keep it with her over the next week in case she needs it at times.  Start strengthening exercises and balance exercises which were shown today.  We will resubmit order for physical therapy to Worker's Comp as she has still not heard about this.  Aleve or ibuprofen if needed.  Icing if needed.  Continue light duty in the meantime and follow-up in 1 month.

## 2018-10-23 DIAGNOSIS — R300 Dysuria: Secondary | ICD-10-CM | POA: Diagnosis not present

## 2018-10-23 DIAGNOSIS — R0602 Shortness of breath: Secondary | ICD-10-CM | POA: Diagnosis not present

## 2018-10-23 DIAGNOSIS — Z0000 Encounter for general adult medical examination without abnormal findings: Secondary | ICD-10-CM | POA: Diagnosis not present

## 2018-10-23 DIAGNOSIS — Z1331 Encounter for screening for depression: Secondary | ICD-10-CM | POA: Diagnosis not present

## 2018-10-23 DIAGNOSIS — R0789 Other chest pain: Secondary | ICD-10-CM | POA: Diagnosis not present

## 2018-11-03 ENCOUNTER — Ambulatory Visit (INDEPENDENT_AMBULATORY_CARE_PROVIDER_SITE_OTHER): Admitting: Family Medicine

## 2018-11-03 ENCOUNTER — Encounter: Payer: Self-pay | Admitting: Family Medicine

## 2018-11-03 VITALS — BP 150/91 | HR 89 | Ht 64.0 in | Wt 170.0 lb

## 2018-11-03 DIAGNOSIS — S99912D Unspecified injury of left ankle, subsequent encounter: Secondary | ICD-10-CM

## 2018-11-03 NOTE — Patient Instructions (Signed)
You're doing great! I'll fill out your other paperwork and we will call you when it's ready (will try to get it done over weekend so you have it asap). Continue home exercises for about 2-4 more weeks then as needed. Follow up with me as needed.

## 2018-11-06 ENCOUNTER — Encounter: Payer: Self-pay | Admitting: Family Medicine

## 2018-11-06 NOTE — Progress Notes (Signed)
PCP: Egbert Garibaldi, PA-C  Subjective:   HPI: Patient is a 53 y.o. female here for left ankle injury.  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 She stepped back and came off the slab, felt a pop lateral left ankle when stepping down. Immediate pain with swelling lateral ankle with bruising. Pain level currently an 8/10 in cam walker, sharp. Taking percocet as needed. No other skin changes. No numbness.  12/2: Patient reports her pain is still at a 7/10 level, sharp lateral ankle. Wearing cam walker and using crutches. Associated swelling. Taking ibuprofen during the day with norco at nighttime. No skin changes.  12/30: Patient reports that she has improved since last visit. Pain is more dull lateral left ankle and currently had a 0 out of 10 level in the boot. She is doing home exercises regularly but has not heard from physical therapy. She has been icing the ankle some.  She had pain medication on Christmas day due to increased pain. She is on light duty at work and she does not walk more than just a little bit around the house out of the boot. No skin changes.  Continues to swell.  11/03/18: Patient is doing well. No longer using the boot. Pain level 0/10. Never had PT approved - been doing home exercises. No skin changes.  Past Medical History:  Diagnosis Date  . Asthma   . Pneumonia     Current Outpatient Medications on File Prior to Visit  Medication Sig Dispense Refill  . EPINEPHrine 0.3 mg/0.3 mL IJ SOAJ injection Inject into the muscle.    Marland Kitchen HYDROcodone-acetaminophen (NORCO) 5-325 MG tablet Take 1 tablet by mouth every 6 (six) hours as needed for moderate pain. 20 tablet 0  . pseudoephedrine (SUDAFED 12 HOUR) 120 MG 12 hr tablet Take 1 tablet (120 mg total) by mouth 2 (two) times daily. 30 tablet 0   No current facility-administered medications on file prior to visit.     Past Surgical  History:  Procedure Laterality Date  . CARDIAC CATHETERIZATION    . heart catherterization  12/04/10    Allergies  Allergen Reactions  . Bee Venom Anaphylaxis    Social History   Socioeconomic History  . Marital status: Single    Spouse name: Not on file  . Number of children: Not on file  . Years of education: Not on file  . Highest education level: Not on file  Occupational History  . Not on file  Social Needs  . Financial resource strain: Not on file  . Food insecurity:    Worry: Not on file    Inability: Not on file  . Transportation needs:    Medical: Not on file    Non-medical: Not on file  Tobacco Use  . Smoking status: Former Smoker    Packs/day: 0.00  . Smokeless tobacco: Never Used  Substance and Sexual Activity  . Alcohol use: No    Frequency: Never  . Drug use: No  . Sexual activity: Not on file  Lifestyle  . Physical activity:    Days per week: Not on file    Minutes per session: Not on file  . Stress: Not on file  Relationships  . Social connections:    Talks on phone: Not on file    Gets together: Not on file    Attends religious service: Not on file    Active member of club  or organization: Not on file    Attends meetings of clubs or organizations: Not on file    Relationship status: Not on file  . Intimate partner violence:    Fear of current or ex partner: Not on file    Emotionally abused: Not on file    Physically abused: Not on file    Forced sexual activity: Not on file  Other Topics Concern  . Not on file  Social History Narrative  . Not on file    Family History  Problem Relation Age of Onset  . Cancer Maternal Aunt        breast    BP (!) 150/91   Pulse 89   Ht 5\' 4"  (1.626 m)   Wt 170 lb (77.1 kg)   LMP 06/14/2015   BMI 29.18 kg/m   Review of Systems: See HPI above.     Objective:  Physical Exam:  Gen: NAD, comfortable in exam room  Left foot/ankle: No gross deformity, swelling, ecchymoses FROM with 5/5  strength all directions. TTP minimally distal fibula tip.  No other tenderness. 1+ ant drawer and negative talar tilt.   Negative syndesmotic compression. Thompsons test negative. NV intact distally.  Assessment & Plan:  1. Left ankle injury - 2/2 small distal fibula avulsion fracture.  Much improved.  Continue HEP for 2-4 more weeks then as needed.  Aleve or ibuprofen if needed.  Return to full duty.  F/u prn.

## 2018-12-03 DIAGNOSIS — M79601 Pain in right arm: Secondary | ICD-10-CM | POA: Diagnosis not present

## 2018-12-15 DIAGNOSIS — M79601 Pain in right arm: Secondary | ICD-10-CM | POA: Diagnosis not present

## 2018-12-15 DIAGNOSIS — M65811 Other synovitis and tenosynovitis, right shoulder: Secondary | ICD-10-CM | POA: Diagnosis not present

## 2018-12-22 DIAGNOSIS — R2231 Localized swelling, mass and lump, right upper limb: Secondary | ICD-10-CM | POA: Diagnosis not present

## 2018-12-22 DIAGNOSIS — M79601 Pain in right arm: Secondary | ICD-10-CM | POA: Diagnosis not present

## 2018-12-25 DIAGNOSIS — M65811 Other synovitis and tenosynovitis, right shoulder: Secondary | ICD-10-CM | POA: Diagnosis not present

## 2019-01-31 DIAGNOSIS — R29898 Other symptoms and signs involving the musculoskeletal system: Secondary | ICD-10-CM | POA: Diagnosis not present

## 2019-01-31 DIAGNOSIS — M25611 Stiffness of right shoulder, not elsewhere classified: Secondary | ICD-10-CM | POA: Diagnosis not present

## 2019-01-31 DIAGNOSIS — M65811 Other synovitis and tenosynovitis, right shoulder: Secondary | ICD-10-CM | POA: Diagnosis not present

## 2019-01-31 DIAGNOSIS — Z7409 Other reduced mobility: Secondary | ICD-10-CM | POA: Diagnosis not present

## 2019-02-08 DIAGNOSIS — Z7409 Other reduced mobility: Secondary | ICD-10-CM | POA: Diagnosis not present

## 2019-02-08 DIAGNOSIS — R29898 Other symptoms and signs involving the musculoskeletal system: Secondary | ICD-10-CM | POA: Diagnosis not present

## 2019-02-08 DIAGNOSIS — M25611 Stiffness of right shoulder, not elsewhere classified: Secondary | ICD-10-CM | POA: Diagnosis not present

## 2019-02-08 DIAGNOSIS — M65811 Other synovitis and tenosynovitis, right shoulder: Secondary | ICD-10-CM | POA: Diagnosis not present

## 2019-02-15 DIAGNOSIS — Z7409 Other reduced mobility: Secondary | ICD-10-CM | POA: Diagnosis not present

## 2019-02-15 DIAGNOSIS — R29898 Other symptoms and signs involving the musculoskeletal system: Secondary | ICD-10-CM | POA: Diagnosis not present

## 2019-02-15 DIAGNOSIS — M25611 Stiffness of right shoulder, not elsewhere classified: Secondary | ICD-10-CM | POA: Diagnosis not present

## 2019-02-15 DIAGNOSIS — M65811 Other synovitis and tenosynovitis, right shoulder: Secondary | ICD-10-CM | POA: Diagnosis not present

## 2019-02-20 DIAGNOSIS — M65811 Other synovitis and tenosynovitis, right shoulder: Secondary | ICD-10-CM | POA: Diagnosis not present

## 2019-02-20 DIAGNOSIS — R29898 Other symptoms and signs involving the musculoskeletal system: Secondary | ICD-10-CM | POA: Diagnosis not present

## 2019-02-20 DIAGNOSIS — M25511 Pain in right shoulder: Secondary | ICD-10-CM | POA: Diagnosis not present

## 2019-02-20 DIAGNOSIS — M25611 Stiffness of right shoulder, not elsewhere classified: Secondary | ICD-10-CM | POA: Diagnosis not present

## 2019-02-22 DIAGNOSIS — M65811 Other synovitis and tenosynovitis, right shoulder: Secondary | ICD-10-CM | POA: Diagnosis not present

## 2019-02-22 DIAGNOSIS — M25611 Stiffness of right shoulder, not elsewhere classified: Secondary | ICD-10-CM | POA: Diagnosis not present

## 2019-02-22 DIAGNOSIS — R29898 Other symptoms and signs involving the musculoskeletal system: Secondary | ICD-10-CM | POA: Diagnosis not present

## 2019-02-22 DIAGNOSIS — M25511 Pain in right shoulder: Secondary | ICD-10-CM | POA: Diagnosis not present

## 2019-03-23 DIAGNOSIS — M65811 Other synovitis and tenosynovitis, right shoulder: Secondary | ICD-10-CM | POA: Diagnosis not present

## 2019-04-23 DIAGNOSIS — E7800 Pure hypercholesterolemia, unspecified: Secondary | ICD-10-CM | POA: Diagnosis not present

## 2019-04-23 DIAGNOSIS — Z79899 Other long term (current) drug therapy: Secondary | ICD-10-CM | POA: Diagnosis not present

## 2019-04-23 DIAGNOSIS — R002 Palpitations: Secondary | ICD-10-CM | POA: Diagnosis not present

## 2019-04-23 DIAGNOSIS — Z1159 Encounter for screening for other viral diseases: Secondary | ICD-10-CM | POA: Diagnosis not present

## 2019-04-23 DIAGNOSIS — R5383 Other fatigue: Secondary | ICD-10-CM | POA: Diagnosis not present

## 2019-04-23 DIAGNOSIS — J4520 Mild intermittent asthma, uncomplicated: Secondary | ICD-10-CM | POA: Diagnosis not present

## 2019-05-30 DIAGNOSIS — M25562 Pain in left knee: Secondary | ICD-10-CM | POA: Diagnosis not present

## 2019-05-30 DIAGNOSIS — S83282D Other tear of lateral meniscus, current injury, left knee, subsequent encounter: Secondary | ICD-10-CM | POA: Diagnosis not present

## 2019-05-30 DIAGNOSIS — M659 Synovitis and tenosynovitis, unspecified: Secondary | ICD-10-CM | POA: Diagnosis not present

## 2019-06-05 DIAGNOSIS — S83282D Other tear of lateral meniscus, current injury, left knee, subsequent encounter: Secondary | ICD-10-CM | POA: Diagnosis not present

## 2019-06-05 DIAGNOSIS — M25562 Pain in left knee: Secondary | ICD-10-CM | POA: Diagnosis not present

## 2019-06-05 DIAGNOSIS — M2410 Other articular cartilage disorders, unspecified site: Secondary | ICD-10-CM | POA: Diagnosis not present

## 2019-06-05 DIAGNOSIS — M659 Synovitis and tenosynovitis, unspecified: Secondary | ICD-10-CM | POA: Diagnosis not present

## 2019-06-05 DIAGNOSIS — R609 Edema, unspecified: Secondary | ICD-10-CM | POA: Diagnosis not present

## 2019-06-08 DIAGNOSIS — M65862 Other synovitis and tenosynovitis, left lower leg: Secondary | ICD-10-CM | POA: Diagnosis not present

## 2019-06-08 DIAGNOSIS — M25562 Pain in left knee: Secondary | ICD-10-CM | POA: Diagnosis not present

## 2019-06-08 DIAGNOSIS — M94262 Chondromalacia, left knee: Secondary | ICD-10-CM | POA: Diagnosis not present

## 2019-06-21 DIAGNOSIS — S83282D Other tear of lateral meniscus, current injury, left knee, subsequent encounter: Secondary | ICD-10-CM | POA: Diagnosis not present

## 2019-07-20 DIAGNOSIS — M25562 Pain in left knee: Secondary | ICD-10-CM | POA: Diagnosis not present

## 2019-07-27 IMAGING — DX DG FOOT COMPLETE 3+V*L*
3 series · 3 of 3 positions shown · non-contrast
Comparison: None.

CLINICAL DATA: Fall

EXAM:
LEFT FOOT - COMPLETE 3+ VIEW

[foot ap]
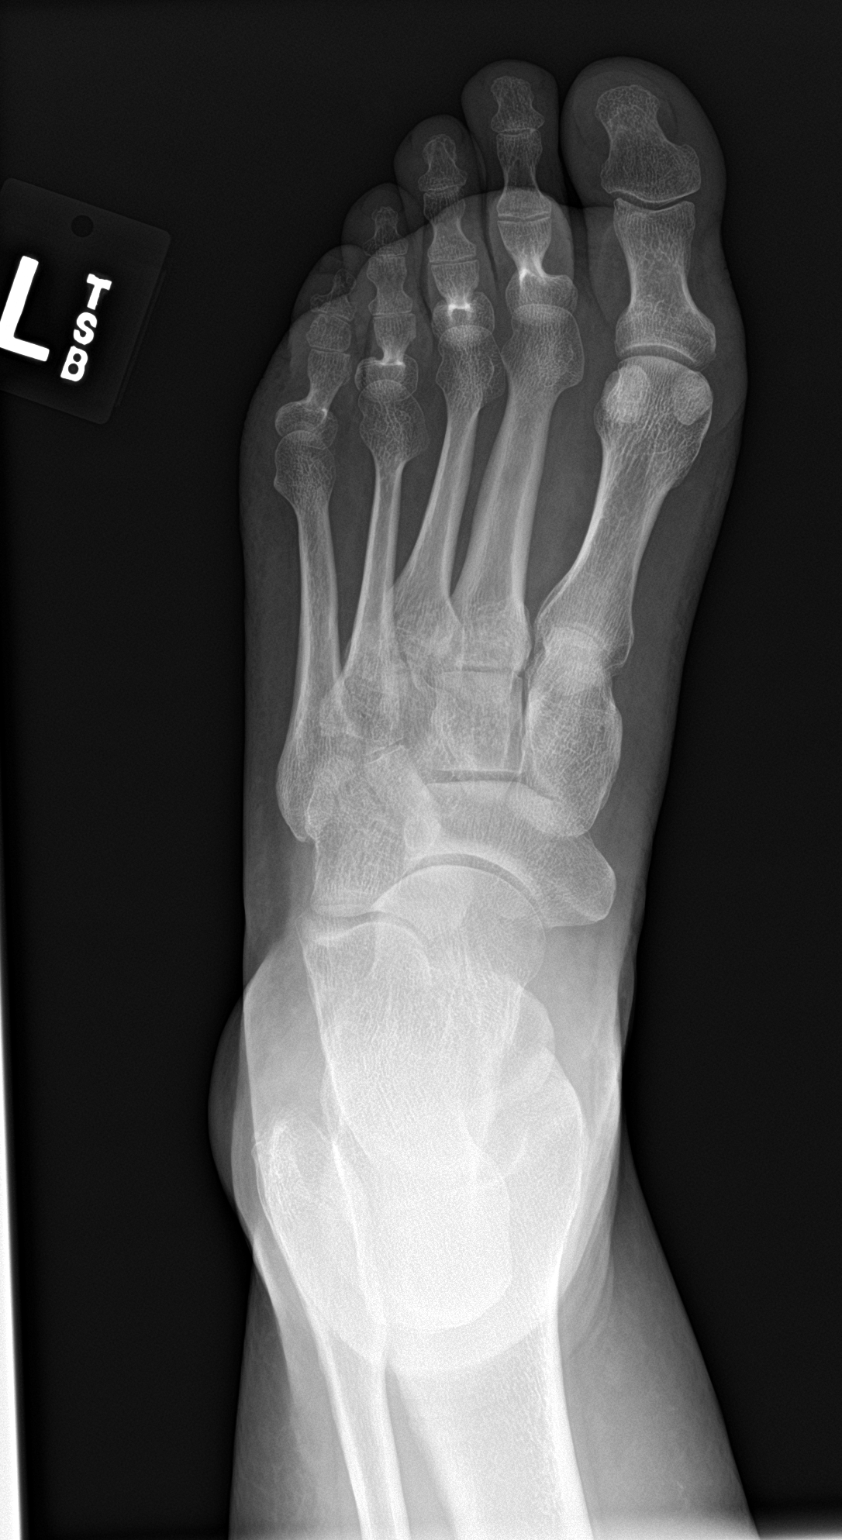

[foot obl]
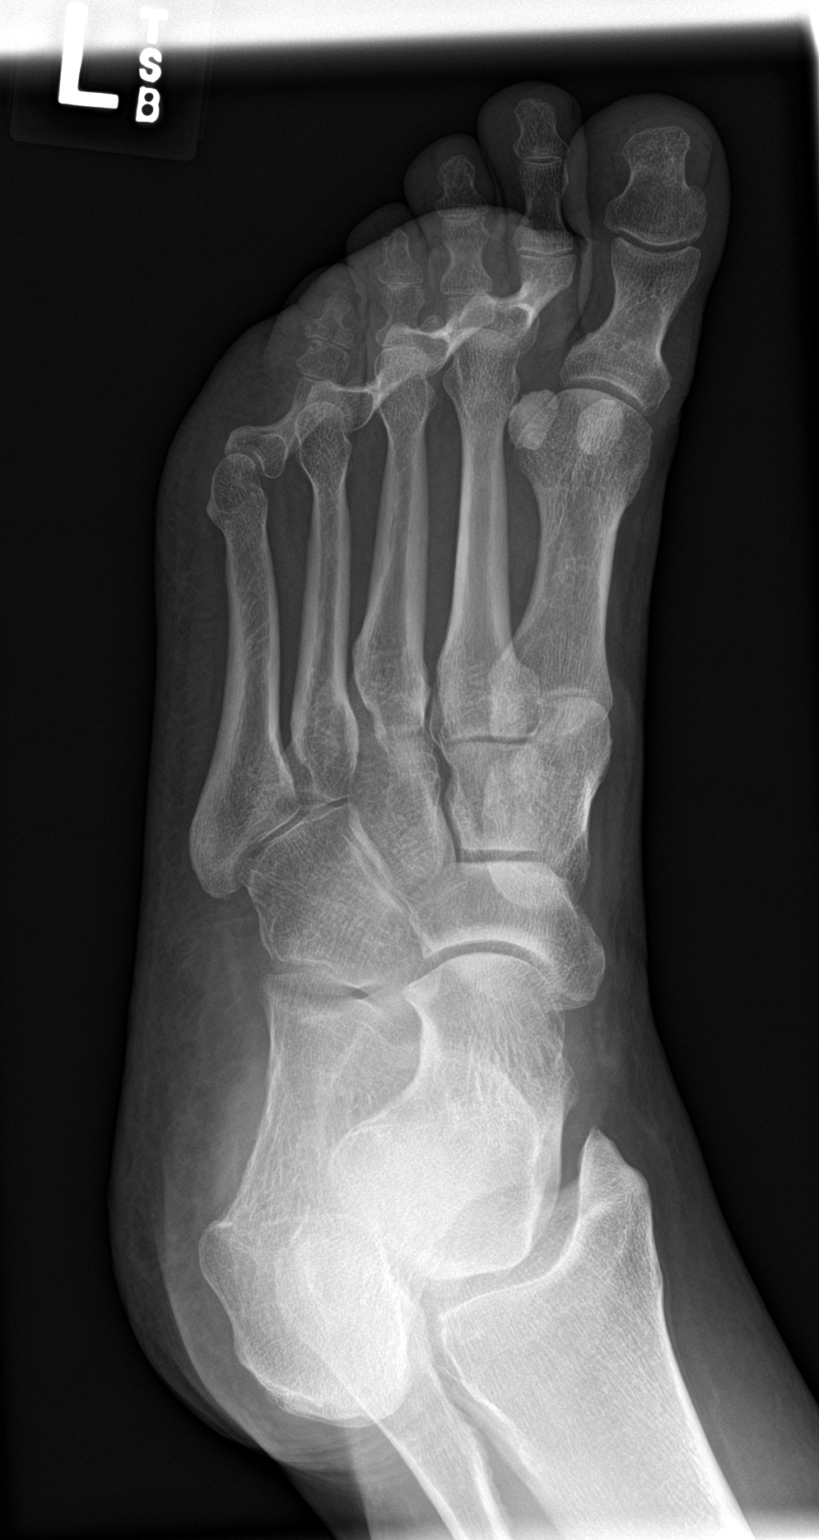

[foot lat]
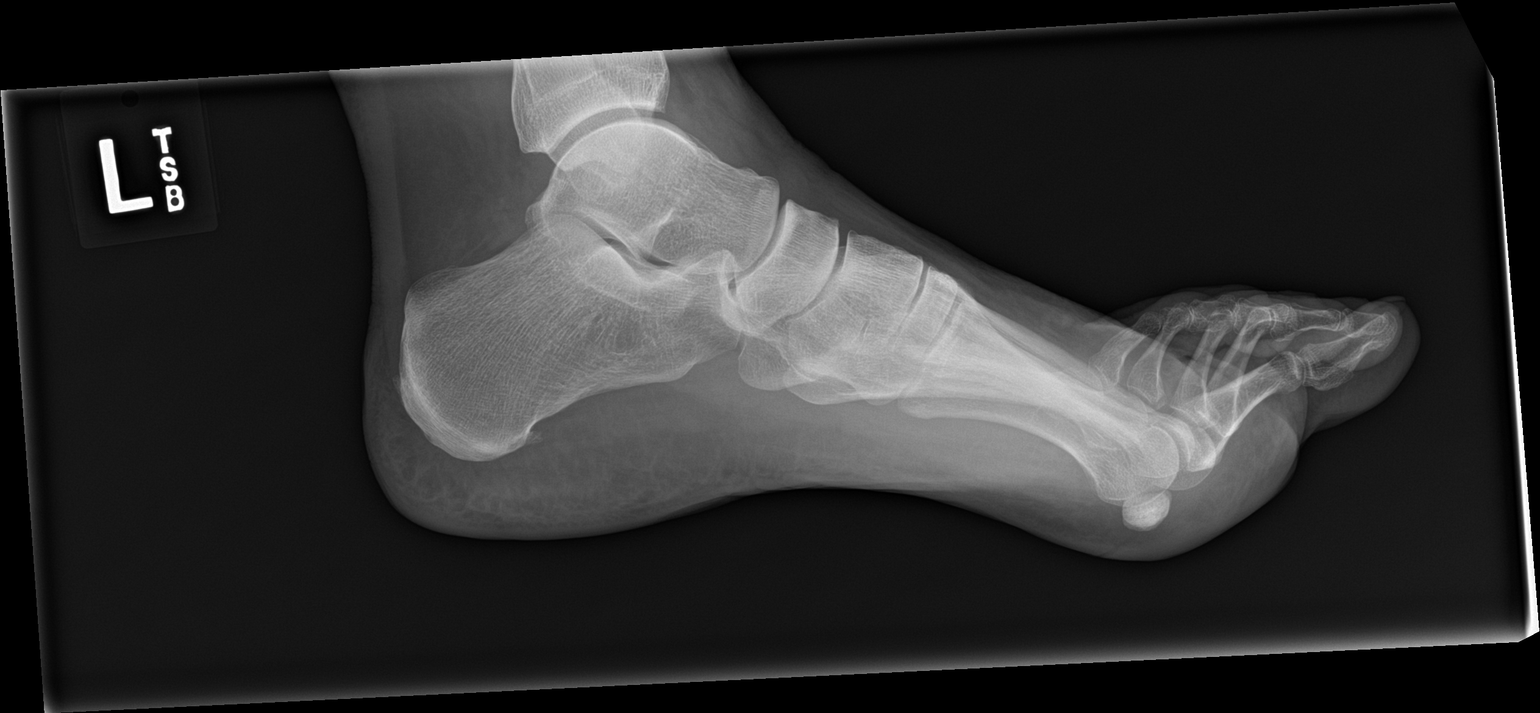

[3 of 3 positions shown; findings below may reference images not displayed]

FINDINGS: There is no evidence of fracture or dislocation. There is no
evidence of arthropathy or other focal bone abnormality. Soft
tissues are unremarkable.
IMPRESSION: Negative.

## 2019-08-08 DIAGNOSIS — M2560 Stiffness of unspecified joint, not elsewhere classified: Secondary | ICD-10-CM | POA: Diagnosis not present

## 2019-08-08 DIAGNOSIS — M25562 Pain in left knee: Secondary | ICD-10-CM | POA: Diagnosis not present

## 2019-08-08 DIAGNOSIS — M6281 Muscle weakness (generalized): Secondary | ICD-10-CM | POA: Diagnosis not present

## 2019-08-08 DIAGNOSIS — R262 Difficulty in walking, not elsewhere classified: Secondary | ICD-10-CM | POA: Diagnosis not present
